# Patient Record
Sex: Female | Born: 1995 | Race: White | Hispanic: No | Marital: Married | Smoking: Never smoker
Health system: Southern US, Community
[De-identification: ages and names within clinical notes are randomized; demographics above are authoritative.]

## PROBLEM LIST (undated history)

## (undated) ENCOUNTER — Inpatient Hospital Stay (HOSPITAL_COMMUNITY): Payer: Self-pay

## (undated) DIAGNOSIS — F32A Depression, unspecified: Secondary | ICD-10-CM

## (undated) DIAGNOSIS — G43909 Migraine, unspecified, not intractable, without status migrainosus: Secondary | ICD-10-CM

## (undated) DIAGNOSIS — F329 Major depressive disorder, single episode, unspecified: Secondary | ICD-10-CM

## (undated) HISTORY — DX: Depression, unspecified: F32.A

## (undated) HISTORY — PX: INDUCED ABORTION: SHX677

## (undated) HISTORY — DX: Major depressive disorder, single episode, unspecified: F32.9

## (undated) HISTORY — DX: Migraine, unspecified, not intractable, without status migrainosus: G43.909

---
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
## 2014-01-01 ENCOUNTER — Encounter: Payer: Self-pay | Admitting: Women's Health

## 2014-01-01 ENCOUNTER — Ambulatory Visit (INDEPENDENT_AMBULATORY_CARE_PROVIDER_SITE_OTHER): Payer: Medicaid Other | Admitting: Women's Health

## 2014-01-01 NOTE — Progress Notes (Signed)
Patient ID: Tammie Black, female   DOB: 1995-09-14, 18 y.o.   MRN: [id number] Subjective:    Tammie Black is a 18 y.o. 551P1001 Caucasian female who presents for a postpartum visit. She is 4 weeks postpartum following a spontaneous vaginal delivery at 40.4 gestational weeks. Anesthesia: epidural. I have fully reviewed the prenatal and intrapartum course. Postpartum course has been complicated by pp endometritis @ 2wks pp, resolved w/ cipro. Baby's course has been uncomplicated. Baby is feeding by breast x 1 week, now bottle. Bleeding thinks she is on period now. Bowel function is normal. Bladder function is normal. Patient is not sexually active. Last sexual activity: prior to birth of baby. Contraception method is Nexplanon that was placed prior to her d/c from hospital- states it has been hurting her last few days and feels like it is slightly lower than it was before. Postpartum depression screening: negative. Score 4.  Last pap <21yo.  The following portions of the patient's history were reviewed and updated as appropriate: allergies, current medications, past medical history, past surgical history and problem list.  Review of Systems Pertinent items are noted in HPI.   Filed Vitals:   01/01/14 0939  BP: 98/64  Height: 5\' 5"  (1.651 m)  Weight: 280 lb (127.007 kg)   No LMP recorded.  Objective:   General:  alert, cooperative and no distress   Breasts:  deferred, no complaints  Lungs: clear to auscultation bilaterally  Heart:  regular rate and rhythm  Abdomen: soft, nontender   Vulva: normal  Vagina: normal vagina, menstrual type bleeding  Cervix:  closed  Corpus: Well-involuted  Adnexa:  Non-palpable  Rectal Exam: No hemorrhoids       Nexplanon site: Lt arm at site looks good, no bruising, redness, edema. nexplanon intact and is slightly lower than insertion scar. Mild tenderness to palpation.   Assessment:   Postpartum exam 4 wks s/p SVB Bottlefeeding Depression  screening Contraception counseling   Plan:  Can use ibuprofen/apap/heat/ice to Nexplanon site, if pain doesn't resolve/worsens to let us know Contraception: Nexplanon already in place Follow up in: @ 18yo  for pap & physical or earlier if needed  Marge DuncansBooker, Leala Bryand Randall CNM, [facility] 01/01/2014 9:52 AM

## 2014-01-01 NOTE — Patient Instructions (Signed)
Use ibuprofen or tylenol, ice or heat or alternate both to nexplanon site to see if it helps with pain. If pain doesn't go away or gets worse, please let us know.  Return when you turn 18 years old for your pap smear.

## 2014-01-08 ENCOUNTER — Other Ambulatory Visit: Payer: Self-pay | Admitting: Nurse Practitioner

## 2015-04-12 ENCOUNTER — Ambulatory Visit (INDEPENDENT_AMBULATORY_CARE_PROVIDER_SITE_OTHER): Payer: BLUE CROSS/BLUE SHIELD | Admitting: Pediatrics

## 2015-04-12 ENCOUNTER — Encounter: Payer: Self-pay | Admitting: Pediatrics

## 2015-04-12 VITALS — BP 120/83 | HR 92 | Temp 97.3°F | Ht 65.0 in | Wt 280.8 lb

## 2015-04-12 DIAGNOSIS — K529 Noninfective gastroenteritis and colitis, unspecified: Secondary | ICD-10-CM

## 2015-04-12 MED ORDER — ONDANSETRON 4 MG PO TBDP
4.0000 mg | ORAL_TABLET | Freq: Three times a day (TID) | ORAL | Status: DC | PRN
Start: 2015-04-12 — End: 2015-04-29

## 2015-04-12 NOTE — Progress Notes (Signed)
    Subjective:    Patient ID: Tammie Black, female    DOB: Jul 21, 1995, 20 y.o.   MRN: [id number]  CC: Emesis; and Diarrhea   HPI: Tammie Black is a 20 y.o. female presenting for  Emesis; and Diarrhea  Woke up yesterday with HA, acid reflux and nausea, started vomiting then Multiple episodes of diarrhea yesterday Ate [employer] the day before, vomiting started soon after that Maybe felt warm yesterday   Depression screen [facility] 04/12/2015 07/13/2012  Decreased Interest 0 0  Down, Depressed, Hopeless 0 3  PHQ - 2 Score 0 3  Altered sleeping - 3  Tired, decreased energy - 3  Change in appetite - 3  Feeling bad or failure about yourself  - 3  Trouble concentrating - 2  Moving slowly or fidgety/restless - 2  Suicidal thoughts - 0  PHQ-9 Score - 19     Relevant past medical, surgical, family and social history reviewed and updated as indicated. Interim medical history since our last visit reviewed. Allergies and medications reviewed and updated.    ROS: Per HPI unless specifically indicated above  History  Smoking status  . Never Smoker   Smokeless tobacco  . Never Used    Past Medical History Patient Active Problem List   Diagnosis Date Noted  . Postpartum endometritis 12/11/2013  . 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 Obesity 06/14/2013       Objective:    BP 120/83 mmHg  Pulse 92  Temp(Src) 97.3 F (36.3 C) (Oral)  Ht 5\' 5"  (1.651 m)  Wt 280 lb 12.8 oz (127.37 kg)  BMI 46.73 kg/m2  LMP 04/05/2015  Wt Readings from Last 3 Encounters:  04/12/15 280 lb 12.8 oz (127.37 kg)  01/01/14 280 lb (127.007 kg) (100 %*, Z = 2.73)  12/20/13 275 lb (124.739 kg) (100 %*, Z = 2.69)   * Growth percentiles are based on CDC 2-20 Years data.     Gen: NAD, alert, cooperative with exam, NCAT EYES: EOMI, no scleral injection or icterus ENT:  TMs pearly gray b/l, OP without erythema LYMPH: no cervical LAD CV: NRRR, normal S1/S2, no murmur, distal pulses 2+ b/l Resp: CTABL,  no wheezes, normal WOB Abd: +BS, soft, NTND. no guarding or organomegaly Ext: No edema, warm Neuro: Alert and oriented, strength equal b/l UE and LE, coordination grossly normal MSK: normal muscle bulk     Assessment & Plan:    Tammie Black was seen today for emesis and diarrhea. Zofran as needed. Symptoms improving. Lots of fluids.  Diagnoses and all orders for this visit:  Gastroenteritis -     ondansetron (ZOFRAN-ODT) 4 MG disintegrating tablet; Take 1 tablet (4 mg total) by mouth every 8 (eight) hours as needed for nausea or vomiting.    Follow up plan: Return if symptoms worsen or fail to improve.  Rex Krasarol Vincent, MD [facility] 04/12/2015, 9:37 AM

## 2015-04-29 ENCOUNTER — Encounter: Payer: Self-pay | Admitting: Family Medicine

## 2015-04-29 ENCOUNTER — Ambulatory Visit (INDEPENDENT_AMBULATORY_CARE_PROVIDER_SITE_OTHER): Payer: BLUE CROSS/BLUE SHIELD | Admitting: Family Medicine

## 2015-04-29 VITALS — BP 124/75 | HR 89 | Temp 98.9°F | Ht 65.0 in | Wt 280.0 lb

## 2015-04-29 DIAGNOSIS — J069 Acute upper respiratory infection, unspecified: Secondary | ICD-10-CM

## 2015-04-29 MED ORDER — FLUTICASONE PROPIONATE 50 MCG/ACT NA SUSP: 1.0000 | Freq: Two times a day (BID) | NASAL | Status: DC | PRN

## 2015-04-29 NOTE — Progress Notes (Signed)
BP 124/75 mmHg  Pulse 89  Temp(Src) 98.9 F (37.2 C) (Oral)  Ht 5\' 5"  (1.651 m)  Wt 280 lb (127.007 kg)  BMI 46.59 kg/m2  LMP 04/05/2015   Subjective:    Patient ID: Tammie Black, female    DOB: December 29, 1995, 19 y.o.   MRN: [id number]  HPI: Tammie Black is a 20 y.o. female presenting on 04/29/2015 for Sinusitis and Left eye red, draining and itching   HPI Sinus congestion and ear congestion Patient has been having sinus congestion and ear congestion and she woke up this morning with a red eye on the left. She's been using Tylenol Sinus and cold but nothing else. Patient is having postnasal drainage and a cough that is dry and nonproductive and is worse at night than during the day. She denies any fevers or chills or shortness of breath or wheezing. Small amount of eye d/c.  Relevant past medical, surgical, family and social history reviewed and updated as indicated. Interim medical history since our last visit reviewed. Allergies and medications reviewed and updated.  Review of Systems  Constitutional: Negative for fever and chills.  HENT: Positive for congestion, postnasal drip, rhinorrhea, sinus pressure, sneezing and sore throat. Negative for ear discharge and ear pain.   Eyes: Positive for discharge and redness. Negative for pain and visual disturbance.  Respiratory: Positive for cough. Negative for chest tightness and shortness of breath.   Cardiovascular: Negative for chest pain and leg swelling.  Genitourinary: Negative for dysuria and difficulty urinating.  Musculoskeletal: Negative for back pain and gait problem.  Skin: Negative for rash.  Neurological: Negative for light-headedness and headaches.  Psychiatric/Behavioral: Negative for behavioral problems and agitation.  All other systems reviewed and are negative.   Per HPI unless specifically indicated above     Medication List       This list is accurate as of: 04/29/15  1:59 PM.  Always use your most recent  med list.               baclofen 10 MG tablet  Commonly known as:  LIORESAL  Take 10 mg by mouth 3 (three) times daily.     fluticasone 50 MCG/ACT nasal spray  Commonly known as:  FLONASE  Place 1 spray into both nostrils 2 (two) times daily as needed for allergies or rhinitis.     zonisamide 25 MG capsule  Commonly known as:  ZONEGRAN  Take 25 mg by mouth daily.           Objective:    BP 124/75 mmHg  Pulse 89  Temp(Src) 98.9 F (37.2 C) (Oral)  Ht 5\' 5"  (1.651 m)  Wt 280 lb (127.007 kg)  BMI 46.59 kg/m2  LMP 04/05/2015  Wt Readings from Last 3 Encounters:  04/29/15 280 lb (127.007 kg)  04/12/15 280 lb 12.8 oz (127.37 kg)  01/01/14 280 lb (127.007 kg) (100 %*, Z = 2.73)   * Growth percentiles are based on CDC 2-20 Years data.    Physical Exam  Constitutional: She is oriented to person, place, and time. She appears well-developed and well-nourished. No distress.  HENT:  Right Ear: Tympanic membrane, external ear and ear canal normal.  Left Ear: Tympanic membrane, external ear and ear canal normal.  Nose: Mucosal edema and rhinorrhea present. No epistaxis. Right sinus exhibits no maxillary sinus tenderness and no frontal sinus tenderness. Left sinus exhibits no maxillary sinus tenderness and no frontal sinus tenderness.  Mouth/Throat: Uvula is midline and mucous membranes  are normal. Posterior oropharyngeal edema and posterior oropharyngeal erythema present. No oropharyngeal exudate or tonsillar abscesses.  Eyes: Conjunctivae and EOM are normal.  Neck: Neck supple. No thyromegaly present.  Cardiovascular: Normal rate, regular rhythm, normal heart sounds and intact distal pulses.   No murmur heard. Pulmonary/Chest: Effort normal and breath sounds normal. No respiratory distress. She has no wheezes.  Musculoskeletal: Normal range of motion. She exhibits no edema or tenderness.  Lymphadenopathy:    She has no cervical adenopathy.  Neurological: She is alert and  oriented to person, place, and time. Coordination normal.  Skin: Skin is warm and dry. No rash noted. She is not diaphoretic.  Psychiatric: She has a normal mood and affect. Her behavior is normal.  Vitals reviewed.     Assessment & Plan:       Problem List Items Addressed This Visit    None    Visit Diagnoses    Viral upper respiratory infection    -  Primary    Treat with Flonase, antihistamine, Mucinex, nasal saline. If not improved in 3-4 days give [place] a call and we can send an antibiotic.    Relevant Medications    fluticasone (FLONASE) 50 MCG/ACT nasal spray       Follow up plan: Return if symptoms worsen or fail to improve.  Counseling provided for all of the vaccine components No orders of the defined types were placed in this encounter.    Arville Care, MD [facility] 04/29/2015, 1:59 PM

## 2015-05-03 ENCOUNTER — Encounter (INDEPENDENT_AMBULATORY_CARE_PROVIDER_SITE_OTHER): Payer: Self-pay

## 2015-05-03 ENCOUNTER — Ambulatory Visit (INDEPENDENT_AMBULATORY_CARE_PROVIDER_SITE_OTHER): Payer: BLUE CROSS/BLUE SHIELD | Admitting: Nurse Practitioner

## 2015-05-03 ENCOUNTER — Encounter: Payer: Self-pay | Admitting: Nurse Practitioner

## 2015-05-03 VITALS — BP 106/76 | HR 93 | Temp 97.5°F | Ht 65.0 in | Wt 281.0 lb

## 2015-05-03 DIAGNOSIS — H66001 Acute suppurative otitis media without spontaneous rupture of ear drum, right ear: Secondary | ICD-10-CM

## 2015-05-03 MED ORDER — AMOXICILLIN 875 MG PO TABS: 875.0000 mg | ORAL_TABLET | Freq: Two times a day (BID) | ORAL | Status: DC

## 2015-05-03 NOTE — Progress Notes (Signed)
   Subjective:    Patient ID: Tammie Black, female    DOB: 05-Jul-1995, 20 y.o.   MRN: [id number] Pt was in last week with cough. Was told to take Claritin and Mucinex. Gave Rx for Flonase. Now feeling pressure in the right ear effecting her hearing.  Ear Fullness  There is pain in the right ear. This is a new problem. The current episode started in the past 7 days. The problem occurs constantly. The problem has been gradually worsening. There has been no fever. The pain is at a severity of 0/10. The patient is experiencing no pain. Associated symptoms include hearing loss. She has tried nothing for the symptoms. The treatment provided mild relief.      Review of Systems  HENT: Positive for hearing loss.        Feels a continuous pressure in the right ear. Denies any discharge or pain. Yawning relieves the pressure short term but it returns.  Eyes: Negative.   Respiratory: Negative.   Musculoskeletal: Negative.   Skin: Negative.   Neurological: Negative.        Objective:   Physical Exam  Constitutional: She is oriented to person, place, and time. She appears well-developed and well-nourished.  HENT:  Left Ear: External ear normal.  Nose: Nose normal.  Mouth/Throat: Oropharynx is clear and moist.  Scar on TM. Redness noted  Eyes: Conjunctivae are normal. Pupils are equal, round, and reactive to light.  Neck: Normal range of motion.  Cardiovascular: Normal rate, regular rhythm, normal heart sounds and intact distal pulses.   Pulmonary/Chest: Effort normal and breath sounds normal.  Abdominal: Soft.  Musculoskeletal: Normal range of motion.  Neurological: She is alert and oriented to person, place, and time.  Skin: Skin is warm and dry.  Psychiatric: She has a normal mood and affect. Her behavior is normal. Judgment and thought content normal.   BP 106/76 mmHg  Pulse 93  Temp(Src) 97.5 F (36.4 C) (Oral)  Ht 5\' 5"  (1.651 m)  Wt 281 lb (127.461 kg)  BMI 46.76 kg/m2  LMP  04/05/2015        Assessment & Plan:   1. Acute suppurative otitis media of right ear without spontaneous rupture of tympanic membrane, recurrence not specified    Meds ordered this encounter  Medications  . amoxicillin (AMOXIL) 875 MG tablet    Sig: Take 1 tablet (875 mg total) by mouth 2 (two) times daily. 1 po BID    Dispense:  20 tablet    Refill:  0    Order Specific Question:  Supervising Provider    Answer:  Deborra MedinaMOORE, DONALD W [[id number]]   Motrin or tylenol OTC Force fluids RTO prn  Mary-Margaret Daphine DeutscherMartin, FNP

## 2015-05-03 NOTE — Patient Instructions (Signed)

## 2015-05-07 ENCOUNTER — Other Ambulatory Visit: Payer: BLUE CROSS/BLUE SHIELD | Admitting: Women's Health

## 2015-05-20 ENCOUNTER — Other Ambulatory Visit (HOSPITAL_COMMUNITY)
Admission: RE | Admit: 2015-05-20 | Discharge: 2015-05-20 | Disposition: A | Discharge status: Home / Self Care | Payer: BLUE CROSS/BLUE SHIELD | Source: Ambulatory Visit | Attending: Obstetrics & Gynecology | Admitting: Obstetrics & Gynecology

## 2015-05-20 ENCOUNTER — Ambulatory Visit (INDEPENDENT_AMBULATORY_CARE_PROVIDER_SITE_OTHER): Payer: BLUE CROSS/BLUE SHIELD | Admitting: Women's Health

## 2015-05-20 ENCOUNTER — Encounter: Payer: Self-pay | Admitting: Women's Health

## 2015-05-20 VITALS — BP 116/70 | HR 64 | Ht 66.25 in | Wt 282.0 lb

## 2015-05-20 DIAGNOSIS — Z113 Encounter for screening for infections with a predominantly sexual mode of transmission: Secondary | ICD-10-CM | POA: Diagnosis present

## 2015-05-20 DIAGNOSIS — Z01419 Encounter for gynecological examination (general) (routine) without abnormal findings: Secondary | ICD-10-CM | POA: Insufficient documentation

## 2015-05-20 NOTE — Progress Notes (Signed)
Patient ID: Tammie Black, female   DOB: Jun 22, 1995, 20 y.o.   MRN: [id number] Subjective:   Tammie ShoeMonica Allmon is a 20 y.o. 881P1001 Caucasian female here for a routine well-woman exam.  No LMP recorded. Patient has had an implant.    Current complaints: none PCP: Paulene FloorMary Martin, [facility]       Does desire labs, declines STI testing other than gc/ct  Social History: Sexual: heterosexual Marital Status: married Living situation: with parents, [place] d/t work Occupation: [employer] [place], stock Tobacco/alcohol: no tobacoo, etoh: none Illicit drugs: no history of illicit drug use  The following portions of the patient's history were reviewed and updated as appropriate: allergies, current medications, past family history, past medical history, past social history, past surgical history and problem list.  Past Medical History Past Medical History  Diagnosis Date  . Migraines   . Depression     Past Surgical History Past Surgical History  Procedure Laterality Date  . Induced abortion      Gynecologic History G1P1001  No LMP recorded. Patient has had an implant. Contraception: Nexplanon 11/29/13 Last Pap: never. Results were: n/a Last mammogram: never. Results were: n/a Last TCS: never  Obstetric History OB History  Gravida Para Term Preterm AB SAB TAB Ectopic Multiple Living  1 1 1       1     # Outcome Date GA Lbr Len/2nd Weight Sex Delivery Anes PTL Lv  1 Term 11/27/13 [id number] 15:24 / 04:31  M Vag-Spont EPI  Y      Current Medications Current Outpatient Prescriptions on File Prior to Visit  Medication Sig Dispense Refill  . baclofen (LIORESAL) 10 MG tablet Take 10 mg by mouth 3 (three) times daily.    Marland Kitchen. zonisamide (ZONEGRAN) 25 MG capsule Take 25 mg by mouth daily.    Marland Kitchen. amoxicillin (AMOXIL) 875 MG tablet Take 1 tablet (875 mg total) by mouth 2 (two) times daily. 1 po BID (Patient not taking: Reported on 05/20/2015) 20 tablet 0  . fluticasone (FLONASE)  50 MCG/ACT nasal spray Place 1 spray into both nostrils 2 (two) times daily as needed for allergies or rhinitis. (Patient not taking: Reported on 05/20/2015) 16 g 6   No current facility-administered medications on file prior to visit.    Review of Systems Patient denies any headaches, blurred vision, shortness of breath, chest pain, abdominal pain, problems with bowel movements, urination, or intercourse.  Objective:  BP 116/70 mmHg  Pulse 64  Ht 5' 6.25" (1.683 m)  Wt 282 lb (127.914 kg)  BMI 45.16 kg/m2 Physical Exam  General:  Well developed, well nourished, no acute distress. She is alert and oriented x3. Skin:  Warm and dry Neck:  Midline trachea, no thyromegaly or nodules Cardiovascular: Regular rate and rhythm, no murmur heard Lungs:  Effort normal, all lung fields clear to auscultation bilaterally Breasts:  No dominant palpable mass, retraction, or nipple discharge Abdomen:  Soft, non tender, no hepatosplenomegaly or masses Pelvic:  External genitalia is normal in appearance.  The vagina is normal in appearance. The cervix is bulbous, no CMT.  Thin prep pap is done w/ reflex HR HPV cotesting. Uterus is felt to be normal size, shape, and contour.  No adnexal masses or tenderness noted. Extremities:  No swelling or varicosities noted Psych:  She has a normal mood and affect  Assessment:   Healthy well-woman exam Obesity  Plan:  GC/CT from pap, CBC, CMP, TSH today F/U [id number] for physical, or sooner if  needed Mammogram  or sooner if problems Colonoscopy  or sooner if problems  Marge Duncans CNM, [facility] 05/20/2015 12:11 PM

## 2015-05-21 LAB — COMPREHENSIVE METABOLIC PANEL
ALBUMIN: 4.3 g/dL (ref 3.5–5.5)
ALK PHOS: 108 IU/L (ref 39–117)
ALT: 14 IU/L (ref 0–32)
AST: 15 IU/L (ref 0–40)
Albumin/Globulin Ratio: 1.9 (ref 1.2–2.2)
BUN / CREAT RATIO: 15 (ref 9–23)
BUN: 9 mg/dL (ref 6–20)
Bilirubin Total: 0.6 mg/dL (ref 0.0–1.2)
CO2: 20 mmol/L (ref 18–29)
Calcium: 9 mg/dL (ref 8.7–10.2)
Chloride: 104 mmol/L (ref 96–106)
Creatinine, Ser: 0.62 mg/dL (ref 0.57–1.00)
GFR calc Af Amer: 149 mL/min/{1.73_m2} (ref >59)
GFR calc non Af Amer: 129 mL/min/{1.73_m2} (ref >59)
Globulin, Total: 2.3 g/dL (ref 1.5–4.5)
Glucose: 83 mg/dL (ref 65–99)
Potassium: 4.7 mmol/L (ref 3.5–5.2)
SODIUM: 140 mmol/L (ref 134–144)
Total Protein: 6.6 g/dL (ref 6.0–8.5)

## 2015-05-21 LAB — CBC
HEMOGLOBIN: 14.6 g/dL (ref 11.1–15.9)
Hematocrit: 43.2 % (ref 34.0–46.6)
MCH: 31.2 pg (ref 26.6–33.0)
MCHC: 33.8 g/dL (ref 31.5–35.7)
MCV: 92 fL (ref 79–97)
Platelets: 306 10*3/uL (ref 150–379)
RBC: 4.68 x10E6/uL (ref 3.77–5.28)
RDW: 13.3 % (ref 12.3–15.4)
WBC: 8.8 10*3/uL (ref 3.4–10.8)

## 2015-05-21 LAB — CYTOLOGY - PAP

## 2015-05-21 LAB — TSH: TSH: 1.31 u[IU]/mL (ref 0.450–4.500)

## 2015-07-31 ENCOUNTER — Telehealth: Payer: Self-pay | Admitting: Nurse Practitioner

## 2015-07-31 NOTE — Telephone Encounter (Signed)
Pt needed appt appt scheduled Pt notified

## 2015-08-01 ENCOUNTER — Encounter: Payer: Self-pay | Admitting: Family

## 2015-08-01 ENCOUNTER — Ambulatory Visit (INDEPENDENT_AMBULATORY_CARE_PROVIDER_SITE_OTHER): Payer: BLUE CROSS/BLUE SHIELD | Admitting: Family

## 2015-08-01 VITALS — BP 119/73 | HR 74 | Temp 97.7°F | Ht 66.5 in | Wt 281.0 lb

## 2015-08-01 DIAGNOSIS — L0291 Cutaneous abscess, unspecified: Secondary | ICD-10-CM

## 2015-08-01 MED ORDER — SULFAMETHOXAZOLE-TRIMETHOPRIM 800-160 MG PO TABS: 1.0000 | ORAL_TABLET | Freq: Two times a day (BID) | ORAL | Status: DC

## 2015-08-01 NOTE — Progress Notes (Signed)
   Subjective:    Patient ID: Tammie Black, female    DOB: 03-Dec-1995, 20 y.o.   MRN: [id number]  HPI PT presents to the office today with a "bump" under her right axillary a few months ago. PT states she squeezed it a few times a got a "white drainage" and it became smaller. PT states that it has since grown, but over the last few days it has become small again. PT states she has intermittent pain of 3 out 10 and it is worse when she touches it.     Review of Systems  Constitutional: Negative.   HENT: Negative.   Eyes: Negative.   Respiratory: Negative.  Negative for shortness of breath.   Cardiovascular: Negative.  Negative for palpitations.  Gastrointestinal: Negative.   Endocrine: Negative.   Genitourinary: Negative.   Musculoskeletal: Negative.   Neurological: Negative.  Negative for headaches.  Hematological: Negative.   Psychiatric/Behavioral: Negative.   All other systems reviewed and are negative.      Objective:   Physical Exam  Constitutional: She is oriented to person, place, and time. She appears well-developed and well-nourished. No distress.  HENT:  Head: Normocephalic.  Eyes: Pupils are equal, round, and reactive to light.  Neck: Normal range of motion. Neck supple. No thyromegaly present.  Cardiovascular: Normal rate, regular rhythm, normal heart sounds and intact distal pulses.   No murmur heard. Pulmonary/Chest: Effort normal and breath sounds normal. No respiratory distress. She has no wheezes.  Abdominal: Soft. Bowel sounds are normal. She exhibits no distension. There is no tenderness.  Musculoskeletal: Normal range of motion. She exhibits no edema or tenderness.  Neurological: She is alert and oriented to person, place, and time.  Skin: Skin is warm and dry.  Small erythemas abscess under right axillary  approx 1X1cm  Psychiatric: She has a normal mood and affect. Her behavior is normal. Judgment and thought content normal.  Vitals reviewed.    BP  119/73 mmHg  Pulse 74  Temp(Src) 97.7 F (36.5 C) (Oral)  Ht 5' 6.5" (1.689 m)  Wt 281 lb (127.461 kg)  BMI 44.68 kg/m2      Assessment & Plan:  1. Abscess -Keep clean and dry -Warm compresses -Do not squeeze -RTO prn  - sulfamethoxazole-trimethoprim (BACTRIM DS) 800-160 MG tablet; Take 1 tablet by mouth 2 (two) times daily.  Dispense: 20 tablet; Refill: 0  Jannifer Rodneyhristy Hawks, FNP

## 2015-08-01 NOTE — Patient Instructions (Signed)

## 2015-11-05 ENCOUNTER — Encounter: Payer: Self-pay | Admitting: Family Medicine

## 2015-11-05 ENCOUNTER — Ambulatory Visit (INDEPENDENT_AMBULATORY_CARE_PROVIDER_SITE_OTHER): Payer: BLUE CROSS/BLUE SHIELD | Admitting: Family Medicine

## 2015-11-05 VITALS — BP 124/86 | HR 69 | Temp 97.4°F | Ht 66.5 in | Wt 282.8 lb

## 2015-11-05 DIAGNOSIS — G43909 Migraine, unspecified, not intractable, without status migrainosus: Secondary | ICD-10-CM | POA: Diagnosis not present

## 2015-11-05 DIAGNOSIS — Z23 Encounter for immunization: Secondary | ICD-10-CM | POA: Diagnosis not present

## 2015-11-05 MED ORDER — TOPIRAMATE 50 MG PO TABS: ORAL_TABLET | ORAL | 2 refills | Status: DC

## 2015-11-05 NOTE — Patient Instructions (Signed)
Great to see you!  I have started you on topamax for your headaches, Try 2 aleve and a baclofen in the first 30 minutes of your headache to see if you can get it to stop  Come back in 2 months to see how you are

## 2015-11-05 NOTE — Progress Notes (Signed)
   HPI  Patient presents today here for headaches.  Patient has long history of migraine headaches, previously treated by [facility] Ne[facility]lyFreeman [facility]. She does not have the money to continue going to the clinic so she comes in for evaluation here.  She states that a few months ago she stopped taking Zonegran due to difficulty sleeping with the medication.  Over the last several weeks she's begun to have more and more frequent headaches. Her headaches are described as lasting 10-12 hours, at times several days, bitemporal and biparietal throbbing in nature, no aura, she does have associated nausea and sometimes photophobia.  Uses Nexplanon for birth control. Her last period was about 3 weeks ago.  PMH: Smoking status noted ROS: Per HPI  Objective: BP 124/86   Pulse 69   Temp 97.4 F (36.3 C) (Oral)   Ht 5' 6.5" (1.689 m)   Wt 282 lb 12.8 oz (128.3 kg)   BMI 44.96 kg/m  Gen: NAD, alert, cooperative with exam HEENT: NCAT, EOMI, PERRL CV: RRR, good S1/S2, no murmur Resp: CTABL, no wheezes, non-labored Ext: No edema, warm Neuro: Alert and oriented, No gross deficits  Assessment and plan:  # Migraine headaches Recent increase in headaches, patient would like to start prophylactic treatment Headaches described as almost daily in frequency, previous to that she had 4-5 per month. Previous success with Topamax, she does have controlled currently with the Nexplanon in place Titrate 25 mg to 100 mg Topamax in one month, discussed abortive therapy baclofen plus NSAIDs Return to clinic in 6-8 weeks   Meds ordered this encounter  Medications  . topiramate (TOPAMAX) 50 MG tablet    Sig: Take 1/2 daily for 1 week, then take 1/2 twice daily, then take 1 in morning and 1/2 at night for 1 week, then take 1 twice daily.    Dispense:  60 tablet    Refill:  2    Murtis SinkSam Maelie Chriswell, MD [facility] 11/05/2015, 11:08 AM

## 2015-12-17 ENCOUNTER — Ambulatory Visit: Payer: BLUE CROSS/BLUE SHIELD | Admitting: Obstetrics & Gynecology

## 2015-12-31 ENCOUNTER — Ambulatory Visit (INDEPENDENT_AMBULATORY_CARE_PROVIDER_SITE_OTHER): Payer: BLUE CROSS/BLUE SHIELD | Admitting: Family Medicine

## 2015-12-31 ENCOUNTER — Ambulatory Visit (INDEPENDENT_AMBULATORY_CARE_PROVIDER_SITE_OTHER): Payer: BLUE CROSS/BLUE SHIELD

## 2015-12-31 ENCOUNTER — Encounter: Payer: Self-pay | Admitting: Family Medicine

## 2015-12-31 VITALS — BP 115/71 | HR 84 | Temp 98.6°F | Ht 66.5 in | Wt 286.4 lb

## 2015-12-31 DIAGNOSIS — M546 Pain in thoracic spine: Secondary | ICD-10-CM

## 2015-12-31 DIAGNOSIS — G43009 Migraine without aura, not intractable, without status migrainosus: Secondary | ICD-10-CM | POA: Diagnosis not present

## 2015-12-31 DIAGNOSIS — G8929 Other chronic pain: Secondary | ICD-10-CM

## 2015-12-31 NOTE — Progress Notes (Signed)
   HPI  Patient presents today here to follow-up for migraines and also with new complaint of back pain.  Patient complains of midline thoracic back pain radiating to the midline lumbar spine that started after her epidural 2 years ago. At first it was off and on, when she got a job at [employer] began working more physically she has more frequent pain. She states that turning to the right causes more pain. It's a sharp shooting type pain that happens with bending or twisting and lifting something that lasts a few moments and then resolves. She denies any leg or arm weakness, numbness or tingling. There has been no discrete injury, except that it started after the epidural.  Patient has had very good benefit with Topamax, she's had a significant reduction in her migraine headaches. Baclofen also helps for abortive therapy.  Her birth control is Nexplanon  PMH: Smoking status noted ROS: Per HPI  Objective: BP 115/71   Pulse 84   Temp 98.6 F (37 C) (Oral)   Ht 5' 6.5" (1.689 m)   Wt 286 lb 6.4 oz (129.9 kg)   BMI 45.53 kg/m  Gen: NAD, alert, cooperative with exam HEENT: NCAT CV: RRR, good S1/S2, no murmur Resp: CTABL, no wheezes, non-labored Ext: No edema, warm Neuro: Alert and oriented, strength 5/5 and sensation intact in bilateral lower extremities, negative modified straight leg raise MSK No tenderness to palpation of the spine along the thoracic or lumbar spine midline or paraspinal muscles in the thoracic or lumbar areas  Assessment and plan:  # Thoracic chronic midline back pain Problem Unclear etiology, most likely muscle spasm Plain film today, physical therapy Discussed supportive care including massage, heat, baclofen which you are he has for headaches, as well as NSAIDs  # migraine headaches Good control with Topamax, no changes    Orders Placed This Encounter  Procedures  . DG Thoracic Spine 2 View    Standing Status:   Future    Standing Expiration Date:    03/01/2017    Order Specific Question:   Reason for Exam (SYMPTOM  OR DIAGNOSIS REQUIRED)    Answer:   Thoracic back pain    Order Specific Question:   Is the patient pregnant?    Answer:   No    Order Specific Question:   Preferred imaging location?    Answer:   Internal  . Ambulatory referral to Physical Therapy    Referral Priority:   Routine    Referral Type:   Physical Medicine    Referral Reason:   Specialty Services Required    Requested Specialty:   Physical Therapy    Number of Visits Requested:   1    No orders of the defined types were placed in this encounter.   Murtis SinkSam Chayim Bialas, MD [facility] 12/31/2015, 8:24 AM

## 2015-12-31 NOTE — Patient Instructions (Signed)
Great to see you!  Try the tennis ball massage, heat, and/or baclofen for the back pain.   We will call with x ray results within a week  You will be called by physical therapy to set up PT treatments

## 2016-01-01 ENCOUNTER — Telehealth: Payer: Self-pay | Admitting: Family Medicine

## 2016-01-01 NOTE — Telephone Encounter (Signed)
[email or web address]

## 2016-01-20 ENCOUNTER — Ambulatory Visit: Payer: BLUE CROSS/BLUE SHIELD | Attending: Family Medicine | Admitting: Physical Therapy

## 2016-01-20 DIAGNOSIS — M545 Low back pain: Secondary | ICD-10-CM | POA: Diagnosis not present

## 2016-01-20 DIAGNOSIS — G8929 Other chronic pain: Secondary | ICD-10-CM | POA: Diagnosis present

## 2016-01-20 NOTE — Therapy (Signed)
[facility] [address] [place], [place], [address] Phone: [phone]   Fax:  [phone]  Physical Therapy Evaluation  Patient Details  Name: Tammie ShoeMonica Black MRN: [id number] Date of Birth: [birth year] Referring Provider: Kevin FentonSamuel Bradshaw MD  Encounter Date: 01/20/2016      PT End of Session - 01/20/16 0906    Visit Number 1   Number of Visits 8   Date for PT Re-Evaluation 03/02/16   PT Start Time 0905   PT Stop Time 0944   PT Time Calculation (min) 39 min   Activity Tolerance Patient tolerated treatment well   Behavior During Therapy [facility] for tasks assessed/performed      Past Medical History:  Diagnosis Date  . Depression   . Migraines     Past Surgical History:  Procedure Laterality Date  . INDUCED ABORTION      There were no vitals filed for this visit.       Subjective Assessment - 01/20/16 0907    Subjective Two years ago patient received an epidural and ever since she has had back pain. It has become more frequent almost daily. 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   Diagnostic tests xrays thoracic spine neg.   Patient Stated Goals get rid of pain   Currently in Pain? No/denies  5-6/10 when it occurs   Pain Location Back   Pain Orientation Left;Lower;Mid   Pain Descriptors / Indicators Sharp;Throbbing   Pain Type Chronic pain   Pain Onset More than a month ago   Pain Frequency Intermittent   Aggravating Factors  unknown   Pain Relieving Factors unknown   Effect of Pain on Daily Activities painful ADLS            [facility] - 01/20/16 0001      Assessment   Medical Diagnosis chronic midline thoracic back pain   Referring Provider Kevin FentonSamuel Bradshaw MD   Onset Date/Surgical Date 11/02/13   Next MD Visit nothing scheduled     Precautions   Precautions None     Balance Screen   Has the patient fallen in the past 6 months No   Has the patient had a decrease in activity level because of a  fear of falling?  No   Is the patient reluctant to leave their home because of a fear of falling?  No     Prior Function   Level of Independence Independent     Posture/Postural Control   Posture Comments Rounded shoulders B, L shoulder forward greater than R; Increased lumbar lordosisi     ROM / Strength   AROM / PROM / Strength AROM                           PT Education - 01/20/16 0946    Education provided Yes   Education Details HEP; multiple HF stretches attempted. Patient could feel only supine of EOB, however her bed is not high enough.   Person(s) Educated Patient   Methods Explanation;Demonstration;Handout;Verbal cues   Comprehension Verbalized understanding;Returned demonstration             PT Long Term Goals - 01/20/16 0956      PT LONG TERM GOAL #1   Title I with HEP   Time 6   Period Weeks   Status New     PT LONG TERM GOAL #2   Title Patient to report decreased frequency of pain in back by 75%  Time 6   Period Weeks   Status New     PT LONG TERM GOAL #3   Title Patient to be able to perform ADLS with 1/10 pain or less.   Time 6   Period Weeks   Status New               Plan - 01/20/16 0948    Clinical Impression Statement Patient presents with c/o low back pain beginning after an epidural in 2015. Patient has marked tightness in her L HS and psoas muscles likely contributing to her pain and stands with increased lumbar lordosis. Pelvic landmarks are even and she has no pain with spinal mobs or AROM. Patient lifts a lot in her job and will benefit from core strengthening.   PT Frequency 2x / week   PT Duration 4 weeks   PT Treatment/Interventions ADLs/Self Care Home Management;Electrical Stimulation;Moist Heat;Ultrasound;Therapeutic exercise;Neuromuscular re-education;Patient/family education;Manual techniques;Dry needling   PT Next Visit Plan Release left hip flexor, find a HF stretch that she can do at home, hamstrings  streching, core strength. Postural ed for neutral spine. Progress HEP    PT Home Exercise Plan HS stretch, and core (hooklying with marching)   Consulted and Agree with Plan of Care Patient      Patient will benefit from skilled therapeutic intervention in order to improve the following deficits and impairments:  Pain, Impaired flexibility, Postural dysfunction  Visit Diagnosis: Chronic left-sided low back pain without sciatica - Plan: PT plan of care cert/re-cert     Problem List Patient Active Problem List   Diagnosis Date Noted  . Postpartum endometritis 12/11/2013  . 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 Obesity 06/14/2013    Solon PalmJulie Sharbel Sahagun PT 01/20/2016, 10:03 AM  [facility] [address] [place], [place], [address] Phone: [phone]   Fax:  [phone]  Name: Tammie ShoeMonica Black MRN: [id number] Date of Birth: 1995/05/03

## 2016-01-20 NOTE — Patient Instructions (Signed)
Hamstring Stretch   With other leg bent, foot flat, grasp right leg and slowly try to straighten knee. Hold __30-60 seconds. Repeat __3__ times. Do __2-3__ sessions per day.   Hamstring Stretch, Reclined (Strap, Doorframe)  Lower abdominal/core stability exercises  1. Practice your breathing technique: Inhale through your nose expanding your belly and rib cage. Try not to breathe into your chest. Exhale slowly and gradually out your mouth feeling a sense of softness to your body. Practice multiple times. This can be performed unlimited.  2. Finding the lower abdominals. Laying on your back with the knees bent, place your fingers just below your belly button. Using your breathing technique from above, on your exhale gently pull the belly button away from your fingertips without tensing any other muscles. Practice this 5x. Next, as you exhale, draw belly button inwards and hold onto it...then feel as if you are pulling that muscle across your pelvis like you are tightening a belt. This can be hard to do at first so be patient and practice. Do 5-10 reps 1-3 x day. Always recognize quality over quantity; if your abdominal muscles become tired you will notice you may tighten/contract other muscles. This is the time to take a break.   Practice this first laying on your back, then in sitting, progressing to standing and finally adding it to all your daily movements.   3. Finding your pelvic floor. Using the breathing technique above, when your exhale, this time draw your pelvic floor muscles up as if you were attempting to stop the flow of urination. Be careful NOT to tense any other muscles. This can be hard, BE PATIENT. Try to hold up to 10 seconds repeating 10x. Try 2x a day. Once you feel you are doing this well, add this contraction to exercise #2. First contracting your pelvic floor followed by lower abdominals.   4. Adding leg movements. Add the following leg movements to challenge your ability  to keep your core stable:   Marching: While keeping your pelvis still, lift the right foot a few inches, put it down then lift left foot. This will mimic a march. Then lift the other leg. Return the first leg and then the second.  Start slow to establish control. Once you have control you may speed it up. Do 10-20x. You MUST keep your lower abdominlas contracted while you march. Breathe naturally   Tammie PalmJulie Karlissa Black, PT 01/20/16 9:43 AM [facility] [address] [place], [place], [address] Phone: [phone]   Fax:  [phone]

## 2016-01-23 NOTE — Telephone Encounter (Signed)
Patient aware of xray results on 11/29

## 2016-01-29 ENCOUNTER — Ambulatory Visit: Payer: BLUE CROSS/BLUE SHIELD | Admitting: *Deleted

## 2016-01-29 DIAGNOSIS — G8929 Other chronic pain: Secondary | ICD-10-CM

## 2016-01-29 DIAGNOSIS — M545 Low back pain, unspecified: Secondary | ICD-10-CM

## 2016-01-29 NOTE — Therapy (Signed)
[facility] [address] [place], [place], [address] Phone: [phone]   Fax:  [phone]  Physical Therapy Treatment  Patient Details  Name: Tammie Black MRN: [id number] Date of Birth: 08-13-1995 Referring Provider: Kevin FentonSamuel Bradshaw MD  Encounter Date: 01/29/2016      PT End of Session - 01/29/16 1032    Visit Number 2   Number of Visits 8   Date for PT Re-Evaluation 03/02/16   PT Start Time 0945   PT Stop Time 1039   PT Time Calculation (min) 54 min      Past Medical History:  Diagnosis Date  . Depression   . Migraines     Past Surgical History:  Procedure Laterality Date  . INDUCED ABORTION      There were no vitals filed for this visit.      Subjective Assessment - 01/29/16 0959    Subjective Two years ago patient received an epidural and ever since she has had back pain. It has become more frequent almost daily. 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                         [facility] Adult PT Treatment/Exercise - 01/29/16 0001      Posture/Postural Control   Posture Comments Rounded shoulders B, L shoulder forward greater than R; Increased lumbar lordosisi     Exercises   Exercises Lumbar;Knee/Hip     Lumbar Exercises: Stretches   Active Hamstring Stretch 5 reps;30 seconds  Bil   Quad Stretch 5 reps;30 seconds  standing Both LEs     Modalities   Modalities Electrical Stimulation;Ultrasound;Moist Heat     Moist Heat Therapy   Number Minutes Moist Heat 15 Minutes   Moist Heat Location Lumbar Spine     Electrical Stimulation   Electrical Stimulation Location Thoracolumbar paras IFC 80-150hz  x 15 mins   Electrical Stimulation Goals Pain     Ultrasound   Ultrasound Location Bil LB paras T12-L2   Ultrasound Parameters 1.5 w/cm2 x 10 mins sidelying   Ultrasound Goals Pain                     PT Long Term Goals - 01/20/16 0956      PT LONG TERM GOAL #1   Title I with HEP    Time 6   Period Weeks   Status New     PT LONG TERM GOAL #2   Title Patient to report decreased frequency of pain in back by 75%   Time 6   Period Weeks   Status New     PT LONG TERM GOAL #3   Title Patient to be able to perform ADLS with 1/10 pain or less.   Time 6   Period Weeks   Status New               Plan - 01/29/16 1034    Clinical Impression Statement Pt did fairly well with Rx today and had decreased pain after Rx. She was able to perform hip flexor and HS stretches and is independent in HEP for both.  She also did well with US and other modalities.   PT Frequency 2x / week   PT Duration 4 weeks   PT Treatment/Interventions ADLs/Self Care Home Management;Electrical Stimulation;Moist Heat;Ultrasound;Therapeutic exercise;Neuromuscular re-education;Patient/family education;Manual techniques;Dry needling   PT Next Visit Plan Release left hip flexor, find a HF stretch that she can do at home, hamstrings streching,  core strength. Postural ed for neutral spine. Progress HEP    PT Home Exercise Plan HS stretch, and core (hooklying with marching)   Consulted and Agree with Plan of Care Patient      Patient will benefit from skilled therapeutic intervention in order to improve the following deficits and impairments:  Pain, Impaired flexibility, Postural dysfunction  Visit Diagnosis: Chronic left-sided low back pain without sciatica     Problem List Patient Active Problem List   Diagnosis Date Noted  . Postpartum endometritis 12/11/2013  . 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 Obesity 06/14/2013    RAMSEUR,CHRIS, PTA 01/29/2016, 11:02 AM  [facility] [address] [place], [place], [address] Phone: [phone]   Fax:  [phone]  Name: Tammie Black MRN: [id number] Date of Birth: 09/03/1995

## 2016-02-04 ENCOUNTER — Ambulatory Visit: Payer: BLUE CROSS/BLUE SHIELD | Attending: Family Medicine | Admitting: Physical Therapy

## 2016-02-04 DIAGNOSIS — M545 Low back pain, unspecified: Secondary | ICD-10-CM

## 2016-02-04 DIAGNOSIS — G8929 Other chronic pain: Secondary | ICD-10-CM | POA: Insufficient documentation

## 2016-02-04 NOTE — Therapy (Signed)
[facility] [address] [place], [place], [address] Phone: [phone]   Fax:  [phone]  Physical Therapy Treatment  Patient Details  Name: Tammie ShoeMonica Detore MRN: [id number] Date of Birth: [birth year] Referring Provider: Kevin FentonSamuel Bradshaw MD  Encounter Date: 02/04/2016      PT End of Session - 02/04/16 1052    PT Start Time 0945   PT Stop Time 1041   PT Time Calculation (min) 56 min   Activity Tolerance Patient tolerated treatment well   Behavior During Therapy [facility] for tasks assessed/performed      Past Medical History:  Diagnosis Date  . Depression   . Migraines     Past Surgical History:  Procedure Laterality Date  . INDUCED ABORTION      There were no vitals filed for this visit.      Subjective Assessment - 02/04/16 1020    Subjective My back is feeling good today.   Currently in Pain? Yes   Pain Location Back   Pain Orientation Left;Lower;Mid   Pain Descriptors / Indicators Aching;Throbbing;Sharp   Pain Onset More than a month ago                         [facility] PT Treatment/Exercise - 02/04/16 0001      Electrical Stimulation   Electrical Stimulation Location Thoracolumbar    Electrical Stimulation Action IFC   Electrical Stimulation Parameters 80-150 Hz x 20 minutes.   Electrical Stimulation Goals Pain     Ultrasound   Ultrasound Location Affected lumbar region.   Ultrasound Parameters Left sdly position with folded pillow between knees at 1.50 w/CM2 x 14 minutes.   Ultrasound Goals Pain     Manual Therapy   Manual Therapy Soft tissue mobilization   Manual therapy comments STW/M x 9 minutes.                     PT Long Term Goals - 01/20/16 0956      PT LONG TERM GOAL #1   Title I with HEP   Time 6   Period Weeks   Status New     PT LONG TERM GOAL #2   Title Patient to report decreased frequency of pain in back by 75%   Time 6   Period Weeks   Status New      PT LONG TERM GOAL #3   Title Patient to be able to perform ADLS with 1/10 pain or less.   Time 6   Period Weeks   Status New             Patient will benefit from skilled therapeutic intervention in order to improve the following deficits and impairments:  Pain, Impaired flexibility, Postural dysfunction  Visit Diagnosis: Chronic left-sided low back pain without sciatica     Problem List Patient Active Problem List   Diagnosis Date Noted  . Postpartum endometritis 12/11/2013  . 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 Obesity 06/14/2013    Hershey Knauer, [place] MPT 02/04/2016, 10:53 AM  [facility] [address] [place], [place], [address] Phone: [phone]   Fax:  [phone]  Name: Tammie ShoeMonica Black MRN: [id number] Date of Birth: [birth year]

## 2016-02-11 ENCOUNTER — Encounter: Payer: Self-pay | Admitting: Physician Assistant

## 2016-02-11 ENCOUNTER — Ambulatory Visit (INDEPENDENT_AMBULATORY_CARE_PROVIDER_SITE_OTHER): Payer: BLUE CROSS/BLUE SHIELD | Admitting: Physician Assistant

## 2016-02-11 VITALS — BP 112/74 | HR 82 | Temp 97.8°F | Ht 66.5 in | Wt 286.0 lb

## 2016-02-11 DIAGNOSIS — J0100 Acute maxillary sinusitis, unspecified: Secondary | ICD-10-CM | POA: Diagnosis not present

## 2016-02-11 MED ORDER — AMOXICILLIN 500 MG PO CAPS: 1000.0000 mg | ORAL_CAPSULE | Freq: Two times a day (BID) | ORAL | 0 refills | Status: DC

## 2016-02-11 NOTE — Progress Notes (Signed)
BP 112/74   Pulse 82   Temp 97.8 F (36.6 C) (Oral)   Ht 5' 6.5" (1.689 m)   Wt 286 lb (129.7 kg)   BMI 45.47 kg/m    Subjective:    Patient ID: Tammie Black, female    DOB: 08-Aug-1995, 21 y.o.   MRN: [id number]  HPI: Tammie Black is a 21 y.o. female presenting on 02/11/2016 for Cough; Nasal Congestion; and ear stopped up (Right )  Patient with 5 days worsening congestion, sinus headache.  There is some cough and mild production, denies wheezing.  There has been mild chills, but no high fever.  OTC medications are minimally helpful.  Pressure through the sinuses and sore throat at times from the post nasal drainage.  Mucus is very thick.  Denies blood.  Relevant past medical, surgical, family and social history reviewed and updated as indicated. Allergies and medications reviewed and updated.  Past Medical History:  Diagnosis Date  . Depression   . Migraines     Past Surgical History:  Procedure Laterality Date  . INDUCED ABORTION      Review of Systems  Constitutional: Negative.  Negative for activity change, fatigue and fever.  HENT: Negative.   Eyes: Negative.   Respiratory: Negative.  Negative for cough.   Cardiovascular: Negative.  Negative for chest pain.  Gastrointestinal: Negative.  Negative for abdominal pain.  Endocrine: Negative.   Genitourinary: Negative.  Negative for dysuria.  Musculoskeletal: Negative.   Skin: Negative.   Neurological: Negative.     Allergies as of 02/11/2016   No Known Allergies     Medication List       Accurate as of 02/11/16  8:56 AM. Always use your most recent med list.          amoxicillin 500 MG capsule Commonly known as:  AMOXIL Take 2 capsules (1,000 mg total) by mouth 2 (two) times daily.   baclofen 10 MG tablet Commonly known as:  LIORESAL Take 10 mg by mouth 3 (three) times daily. Reported on 08/01/2015   topiramate 50 MG tablet Commonly known as:  TOPAMAX Take 1/2 daily for 1 week, then take 1/2 twice  daily, then take 1 in morning and 1/2 at night for 1 week, then take 1 twice daily.          Objective:    BP 112/74   Pulse 82   Temp 97.8 F (36.6 C) (Oral)   Ht 5' 6.5" (1.689 m)   Wt 286 lb (129.7 kg)   BMI 45.47 kg/m   No Known Allergies  Physical Exam  Constitutional: She is oriented to person, place, and time. She appears well-developed and well-nourished.  HENT:  Head: Normocephalic and atraumatic.  Right Ear: Tympanic membrane and external ear normal. No middle ear effusion.  Left Ear: Tympanic membrane and external ear normal.  No middle ear effusion.  Nose: Mucosal edema and rhinorrhea present. Right sinus exhibits no maxillary sinus tenderness. Left sinus exhibits no maxillary sinus tenderness.  Mouth/Throat: Uvula is midline. Posterior oropharyngeal erythema present.  Eyes: Conjunctivae and EOM are normal. Pupils are equal, round, and reactive to light. Right eye exhibits no discharge. Left eye exhibits no discharge.  Neck: Normal range of motion.  Cardiovascular: Normal rate, regular rhythm and normal heart sounds.   Pulmonary/Chest: Effort normal and breath sounds normal. No respiratory distress. She has no wheezes.  Abdominal: Soft.  Lymphadenopathy:    She has no cervical adenopathy.  Neurological: She is alert and  oriented to person, place, and time.  Skin: Skin is warm and dry.  Psychiatric: She has a normal mood and affect.        Assessment & Plan:   1. Acute non-recurrent maxillary sinusitis - amoxicillin (AMOXIL) 500 MG capsule; Take 2 capsules (1,000 mg total) by mouth 2 (two) times daily.  Dispense: 40 capsule; Refill: 0   Continue all other maintenance medications as listed above.  Follow up plan: Return if symptoms worsen or fail to improve.  No orders of the defined types were placed in this encounter.   Educational handout given for sinusitis   Remus LofflerAngel S. Wladyslawa Disbro PA-C [facility] [address]  [place],  [place] [address] [phone]   02/11/2016, 8:56 AM

## 2016-02-11 NOTE — Patient Instructions (Signed)

## 2016-02-12 ENCOUNTER — Ambulatory Visit: Payer: BLUE CROSS/BLUE SHIELD | Admitting: Physical Therapy

## 2016-02-12 DIAGNOSIS — M545 Low back pain, unspecified: Secondary | ICD-10-CM

## 2016-02-12 DIAGNOSIS — G8929 Other chronic pain: Secondary | ICD-10-CM

## 2016-02-12 NOTE — Therapy (Addendum)
[facility] [place], [place], [address] Phone: [phone]   Fax:  [phone]  Physical Therapy Treatment  Patient Details  Name: Tammie Black MRN: [id number] Date of Birth: 04-16-1995 Referring Provider: Kenn File MD  Encounter Date: 02/12/2016      PT End of Session - 02/12/16 1146    Visit Number 4   Number of Visits 8   Date for PT Re-Evaluation 03/02/16   PT Start Time 0950   PT Stop Time 1040   PT Time Calculation (min) 50 min   Activity Tolerance Patient tolerated treatment well   Behavior During Therapy [facility] for tasks assessed/performed      Past Medical History:  Diagnosis Date  . Depression   . Migraines     Past Surgical History:  Procedure Laterality Date  . INDUCED ABORTION      There were no vitals filed for this visit.      Subjective Assessment - 02/12/16 1148    Subjective My back feels good today.   Currently in Pain? Yes   Pain Score 1    Pain Location Back   Pain Orientation Right;Left;Mid   Pain Descriptors / Indicators Aching;Throbbing;Sharp   Pain Type Chronic pain   Pain Onset More than a month ago                         [facility] Adult PT Treatment/Exercise - 02/12/16 0001      Exercises   Exercises Knee/Hip     Lumbar Exercises: Aerobic   Stationary Bike Nustep level 3 x 15 minutes.     Modalities   Modalities Ultrasound     Moist Heat Therapy   Number Minutes Moist Heat 20 Minutes   Moist Heat Location Lumbar Spine     Electrical Stimulation   Electrical Stimulation Location Low back.   Electrical Stimulation Action IFC   Electrical Stimulation Parameters 80-150 Hz. x 20 minutes.   Electrical Stimulation Goals Pain     Ultrasound   Ultrasound Location SDLY position.   Ultrasound Parameters U/S at 1.50 W/CM2 x 8 minutes to affected left low back region.                     PT Long Term Goals - 01/20/16 0956      PT LONG  TERM GOAL #1   Title I with HEP   Time 6   Period Weeks   Status New     PT LONG TERM GOAL #2   Title Patient to report decreased frequency of pain in back by 75%   Time 6   Period Weeks   Status New     PT LONG TERM GOAL #3   Title Patient to be able to perform ADLS with 6/65 pain or less.   Time 6   Period Weeks   Status New             Patient will benefit from skilled therapeutic intervention in order to improve the following deficits and impairments:  Pain, Impaired flexibility, Postural dysfunction  Visit Diagnosis: Chronic left-sided low back pain without sciatica     Problem List Patient Active Problem List   Diagnosis Date Noted  . Postpartum endometritis 12/11/2013  . 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 Obesity 06/14/2013   PHYSICAL THERAPY DISCHARGE SUMMARY  Visits from Start of Care: 4.  Current functional level related to goals / functional outcomes: See above.  Remaining deficits: Very low pain-level.   Education / Equipment: HEP. Plan: Patient agrees to discharge.  Patient goals were not met. Patient is being discharged due to not returning since the last visit.  ?????      Olina Melfi, [place] MPT 02/12/2016, 11:53 AM  [facility] [address] [place], [place], [address] Phone: [phone]   Fax:  [phone]  Name: Tammie Black MRN: [id number] Date of Birth: [birth year]

## 2016-02-18 ENCOUNTER — Ambulatory Visit (INDEPENDENT_AMBULATORY_CARE_PROVIDER_SITE_OTHER): Payer: BLUE CROSS/BLUE SHIELD | Admitting: Pediatrics

## 2016-02-18 ENCOUNTER — Encounter: Payer: Self-pay | Admitting: Pediatrics

## 2016-02-18 VITALS — BP 125/79 | HR 76 | Temp 98.3°F | Ht 66.5 in | Wt 289.4 lb

## 2016-02-18 DIAGNOSIS — H6501 Acute serous otitis media, right ear: Secondary | ICD-10-CM

## 2016-02-18 MED ORDER — AZITHROMYCIN 250 MG PO TABS: ORAL_TABLET | ORAL | 0 refills | Status: DC

## 2016-02-18 NOTE — Progress Notes (Signed)
  Subjective:   Patient ID: Tammie Black, female    DOB: [birth year], 21 y.o.   MRN: [id number] CC: Ear Pain (Right pain for 1 week); Cough; and Nasal Congestion  HPI: Tammie Black is a 21 y.o. female presenting for Ear Pain (Right pain for 1 week); Cough; and Nasal Congestion  Seen 7 days ago, given amoxicillin for ear pain, sinus symptoms ongoing then as well Took 500mg  twice a day for several days, then saw she was suppsoed to take 1000mg  BID so increased dose Ear pain has not improved No drainage No fevers Other symptoms better Appetite normal Still slight cough  Relevant past medical, surgical, family and social history reviewed. Allergies and medications reviewed and updated. History  Smoking Status  . Never Smoker  Smokeless Tobacco  . Never Used   ROS: Per HPI   Objective:    BP 125/79   Pulse 76   Temp 98.3 F (36.8 C) (Oral)   Ht 5' 6.5" (1.689 m)   Wt 289 lb 6.4 oz (131.3 kg)   BMI 46.01 kg/m   Wt Readings from Last 3 Encounters:  02/18/16 289 lb 6.4 oz (131.3 kg)  02/11/16 286 lb (129.7 kg)  12/31/15 286 lb 6.4 oz (129.9 kg)    Gen: NAD, alert, cooperative with exam, NCAT EYES: EOMI, no conjunctival injection, or no icterus ENT:  R TM erythematous, clear effusion, canal also slightly erythematou,s no pain with moving auricle LYMPH: +<1cm R side cervical LAD CV: NRRR, normal S1/S2, no murmur, distal pulses 2+ b/l Resp: CTABL, no wheezes, normal WOB Ext: No edema, warm Neuro: Alert and oriented MSK: normal muscle bulk  Assessment & Plan:  Tammie Black was seen today for ear pain, cough and nasal congestion.  Diagnoses and all orders for this visit:  Right acute serous otitis media, recurrence not specified Not improving despite week of amoxicillin, stop amoxicillin, start azithro  Follow up plan: As needed Rex Krasarol Malaia Buchta, MD [facility]

## 2016-02-19 ENCOUNTER — Encounter: Payer: BLUE CROSS/BLUE SHIELD | Admitting: Physical Therapy

## 2016-03-23 ENCOUNTER — Encounter: Payer: Self-pay | Admitting: Women's Health

## 2016-03-23 ENCOUNTER — Ambulatory Visit (INDEPENDENT_AMBULATORY_CARE_PROVIDER_SITE_OTHER): Payer: Self-pay | Admitting: Women's Health

## 2016-03-23 VITALS — BP 120/88 | HR 76 | Ht 67.0 in | Wt 295.0 lb

## 2016-03-23 DIAGNOSIS — B372 Candidiasis of skin and nail: Secondary | ICD-10-CM | POA: Insufficient documentation

## 2016-03-23 NOTE — Patient Instructions (Signed)
Can use over the counter vaginal yeast cream if needed, call if not helping

## 2016-03-23 NOTE — Progress Notes (Signed)
   [facility] Visit  Patient name: Tammie Black Mcnally MRN [id number]  Date of birth: July 27, 1995  CC & HPI:  Tammie Black Vi is a 21 y.o. 361P1001 Caucasian female presenting today for report of itching/irritation outer labia/inner thighs intermittently x 6124yrs, worse in last week or so. Denies abnormal d/c or odor. Hasn't tried anything otc.  No LMP recorded. Patient has had an implant. The current method of family planning is nexplanon. Last pap April 2017, neg  Pertinent History Reviewed:  Medical & Surgical Hx:   Past medical, surgical, family, and social history reviewed in electronic medical record Medications: Reviewed & Updated - see associated section Allergies: Reviewed in electronic medical record  Objective Findings:  Vitals: BP 120/88 (BP Location: Right Arm, Patient Position: Sitting, Cuff Size: Large)   Pulse 76   Ht 5\' 7"  (1.702 m)   Wt 295 lb (133.8 kg)   BMI 46.20 kg/m  Body mass index is 46.2 kg/m.  Physical Examination: General appearance - alert, well appearing, and in no distress Bilateral groin folds- +yeast and onto mons and down bilateral labia majora- all painted w/ gentian violet Spec: cx clear, normal nonodorous d/c  No results found for this or any previous visit (from the past 24 hour(s)).   Assessment & Plan:  A:   Intertrigo candida  P:  Can use otc yeast cream if needed after gentian violet wears off, if not helping, call to let us know  Return for april for physical.  Marge DuncansBooker, Kimberly Randall CNM, [facility]NP[facility]BC 03/23/2016 2:49 PM

## 2016-05-20 ENCOUNTER — Other Ambulatory Visit: Payer: Medicaid Other | Admitting: Women's Health

## 2016-05-21 ENCOUNTER — Ambulatory Visit (INDEPENDENT_AMBULATORY_CARE_PROVIDER_SITE_OTHER): Payer: Medicaid Other | Admitting: Women's Health

## 2016-05-21 ENCOUNTER — Encounter: Payer: Self-pay | Admitting: Women's Health

## 2016-05-21 VITALS — BP 104/64 | HR 67 | Ht 66.0 in | Wt 293.0 lb

## 2016-05-21 DIAGNOSIS — B372 Candidiasis of skin and nail: Secondary | ICD-10-CM

## 2016-05-21 DIAGNOSIS — Z01419 Encounter for gynecological examination (general) (routine) without abnormal findings: Secondary | ICD-10-CM

## 2016-05-21 DIAGNOSIS — Z308 Encounter for other contraceptive management: Secondary | ICD-10-CM | POA: Diagnosis not present

## 2016-05-21 DIAGNOSIS — Z113 Encounter for screening for infections with a predominantly sexual mode of transmission: Secondary | ICD-10-CM

## 2016-05-21 MED ORDER — NYSTATIN 100000 UNIT/GM EX CREA: 1.0000 "application " | TOPICAL_CREAM | Freq: Two times a day (BID) | CUTANEOUS | 1 refills | Status: DC

## 2016-05-21 NOTE — Progress Notes (Signed)
Subjective:   Tammie Black is a 21 y.o. G61P1011 Caucasian female here for a routine well-woman exam.  Patient's last menstrual period was 05/13/2016 (approximate).    Current complaints: yeast in inner leg creases, we painted w/ gentian violet last visit- helped for awhile then came back.  PCP: Ignacia Bayley       Does not desire labs  Social History: Sexual: heterosexual Marital Status: married Living situation: with spouse Occupation: Chartered loss adjuster @ [employer] Tobacco/alcohol: none Illicit drugs: no history of illicit drug use  The following portions of the patient's history were reviewed and updated as appropriate: allergies, current medications, past family history, past medical history, past social history, past surgical history and problem list.  Past Medical History Past Medical History:  Diagnosis Date  . Depression   . Migraines     Past Surgical History Past Surgical History:  Procedure Laterality Date  . INDUCED ABORTION      Gynecologic History G2P1011  Patient's last menstrual period was 05/13/2016 (approximate). Contraception: Nexplanon placed Oct 2015 in hospital Last Pap: 05/2015. Results were: normal Last mammogram: never. Results were: n/a Last TCS: never  Obstetric History OB History  Gravida Para Term Preterm AB Living  SAB TAB Ectopic Multiple Live Births          1    # Outcome Date GA Lbr Len/2nd Weight Sex Delivery Anes PTL Lv  2 Term 11/27/13 [redacted]w[redacted]d 15:24 / 04:31  M Vag-Spont EPI  LIV  1 AB               Current Medications Current Outpatient Prescriptions on File Prior to Visit  Medication Sig Dispense Refill  . topiramate (TOPAMAX) 50 MG tablet Take 1/2 daily for 1 week, then take 1/2 twice daily, then take 1 in morning and 1/2 at night for 1 week, then take 1 twice daily. 60 tablet 2   No current facility-administered medications on file prior to visit.     Review of Systems Patient denies any headaches,  blurred vision, shortness of breath, chest pain, abdominal pain, problems with bowel movements, urination, or intercourse.  Objective:  BP 104/64 (BP Location: Left Arm, Patient Position: Sitting, Cuff Size: Large)   Pulse 67   Ht  (1.676 m)   Wt 293 lb (132.9 kg)   LMP 05/13/2016 (Approximate)   BMI 47.29 kg/m  Physical Exam  General:  Well developed, well nourished, no acute distress. She is alert and oriented x3. Skin:  Warm and dry, + intertrigal candida bilateral inner legs, painted w/ gentian violet Neck:  Midline trachea, no thyromegaly or nodules Cardiovascular: Regular rate and rhythm, no murmur heard Lungs:  Effort normal, all lung fields clear to auscultation bilaterally Breasts:  No dominant palpable mass, retraction, or nipple discharge Abdomen:  Soft, non tender, no hepatosplenomegaly or masses Pelvic:  External genitalia is normal in appearance.  The vagina is normal in appearance. The cervix is bulbous, no CMT.  Thin prep pap is not done. Uterus is felt to be normal size, shape, and contour.  No adnexal masses or tenderness noted. Extremities:  No swelling or varicosities noted Psych:  She has a normal mood and affect  Assessment:   Healthy well-woman exam Candidal intertrigo  Plan:  Send gc/ct Rx nystatin cream BID to inner legs F/U Oct for nexplanon removal, or sooner if needed Mammogram  or sooner if problems Colonoscopy  or sooner if problems  Shawna Clamp  [place], WHNP-BC 05/21/2016 11:59 AM

## 2016-05-24 LAB — GC/CHLAMYDIA PROBE AMP
Chlamydia trachomatis, NAA: NEGATIVE
NEISSERIA GONORRHOEAE BY PCR: NEGATIVE

## 2016-08-24 ENCOUNTER — Ambulatory Visit (INDEPENDENT_AMBULATORY_CARE_PROVIDER_SITE_OTHER): Payer: Self-pay | Admitting: Family

## 2016-08-24 ENCOUNTER — Encounter: Payer: Self-pay | Admitting: Family

## 2016-08-24 VITALS — BP 134/99 | HR 83 | Temp 97.3°F | Ht 66.0 in | Wt 306.0 lb

## 2016-08-24 DIAGNOSIS — S70361A Insect bite (nonvenomous), right thigh, initial encounter: Secondary | ICD-10-CM

## 2016-08-24 DIAGNOSIS — W57XXXA Bitten or stung by nonvenomous insect and other nonvenomous arthropods, initial encounter: Secondary | ICD-10-CM

## 2016-08-24 MED ORDER — DOXYCYCLINE HYCLATE 100 MG PO TABS: 100.0000 mg | ORAL_TABLET | Freq: Two times a day (BID) | ORAL | 0 refills | Status: DC

## 2016-08-24 NOTE — Progress Notes (Addendum)
   Subjective:    Patient ID: Tammie Black, female    DOB: 01/10/1996, 21 y.o.   MRN: [id number]  HPI Pt presents to the office today with complaints of a "bug bite" on right thigh that she noticed several days ago, but has become bigger and worse. Denies any itching at this time, pain, but did have initial itching, fever, or discharge. PT is unsure what bite her, but noticed the erythemas.    Review of Systems  Skin: Positive for rash.  All other systems reviewed and are negative.      Objective:   Physical Exam  Constitutional: She is oriented to person, place, and time. She appears well-developed and well-nourished. No distress.  HENT:  Head: Normocephalic.  Cardiovascular: Normal rate, regular rhythm, normal heart sounds and intact distal pulses.   No murmur heard. Pulmonary/Chest: Effort normal and breath sounds normal. No respiratory distress. She has no wheezes.  Musculoskeletal: Normal range of motion. She exhibits no edema or tenderness.  Neurological: She is alert and oriented to person, place, and time.  Skin: Skin is warm and dry. Rash noted.  Erythemas circular patch approx 4cmX3cm, scattered ecchymosis present around  Psychiatric: She has a normal mood and affect. Her behavior is normal. Judgment and thought content normal.  Vitals reviewed.       BP (!) 134/99   Pulse 83   Temp (!) 97.3 F (36.3 C) (Oral)   Ht 5\' 6"  (1.676 m)   Wt (!) 306 lb (138.8 kg)   BMI 49.39 kg/m      Assessment & Plan:  1. Insect bite, initial encounter Do not scratch Apply hydrocortisone cream OTC Will do lab work to rule out RMSF and Lyme Report any fevers, joint pain, or new rash RTO prn  - Rocky mtn spotted fvr abs pnl(IgG+IgM) - Lyme Ab/Western Blot Reflex - doxycycline (VIBRA-TABS) 100 MG tablet; Take 1 tablet (100 mg total) by mouth 2 (two) times daily.  Dispense: 20 tablet; Refill: 0    Jannifer Rodneyhristy Hawks, FNP

## 2016-08-24 NOTE — Patient Instructions (Signed)
Insect Bite, Adult An insect bite can make your skin red, itchy, and swollen. An insect bite is different from an insect sting, which happens when an insect injects poison (venom) into the skin. Some insects can spread disease to people through a bite. However, most insect bites do not lead to disease and are not serious. What are the causes? Insects may bite for a variety of reasons, including:  Hunger.  To defend themselves.  Insects that bite include:  Spiders.  Mosquitoes.  Ticks.  Fleas.  Ants.  Flies.  Bedbugs.  What are the signs or symptoms? Symptoms of this condition include:  Itching or pain in the bite area.  Redness and swelling in the bite area.  An open wound (skin ulcer).  In many cases, symptoms last for 2-4 days. How is this diagnosed? This condition is usually diagnosed based on symptoms and a physical exam. How is this treated? Treatment is usually not needed. Symptoms often go away on their own. When treatment is recommended, it may involve:  Applying a cream or lotion to the bitten area. This treatment helps with itching.  Taking an antibiotic medicine. This treatment is needed if the bite area gets infected.  Getting a tetanus shot.  Applying ice to the affected area.  Medicines called antihistamines. This treatment is needed if you develop an allergic reaction to the insect bite.  Follow these instructions at home: Bite area care  Do not scratch the bite area.  Keep the bite area clean and dry. Wash it every day with soap and water as told by your health care provider.  Check the bite area every day for signs of infection. Check for: ? More redness, swelling, or pain. ? Fluid or blood. ? Warmth. ? Pus. Managing pain, itching, and swelling   You may apply a baking soda paste, cortisone cream, or calamine lotion to the bite area as told by your health care provider.  If directed, applyice to the bite area. ? Put ice in a  plastic bag. ? Place a towel between your skin and the bag. ? Leave the ice on for 20 minutes, 2-3 times per day. Medicines  Apply or take over-the-counter and prescription medicines only as told by your health care provider.  If you were prescribed an antibiotic medicine, use it as told by your health care provider. Do not stop using the antibiotic even if your condition improves. General instructions  Keep all follow-up visits as told by your health care provider. This is important. How is this prevented? To help reduce your risk of insect bites:  When you are outdoors, wear clothing that covers your arms and legs.  Use insect repellent. The best insect repellents contain: ? DEET, picaridin, oil of lemon eucalyptus (OLE), or IR3535. ? Higher amounts of an active ingredient.  If your home windows do not have screens, consider installing them.  Contact a health care provider if:  You have more redness, swelling, or pain in the bite area.  You have fluid, blood, or pus coming from the bite area.  The bite area feels warm to the touch.  You have a fever. Get help right away if:  You have joint pain.  You have a rash.  You have shortness of breath.  You feel unusually tired or sleepy.  You have neck pain.  You have a headache.  You have unusual weakness.  You have chest pain.  You have nausea, vomiting, or pain in the abdomen. This   information is not intended to replace advice given to you by your health care provider. Make sure you discuss any questions you have with your health care provider. Document Released: 02/27/2004 Document Revised: 09/18/2015 Document Reviewed: 07/29/2015 Elsevier Interactive Patient Education  2018 Elsevier Inc.  

## 2016-08-27 LAB — LYME AB/WESTERN BLOT REFLEX: Lyme IgG/IgM Ab: <0.91 {ISR} (ref 0.00–0.90)

## 2016-08-27 LAB — ROCKY MTN SPOTTED FVR ABS PNL(IGG+IGM)
RMSF IgG: NEGATIVE
RMSF IgM: 0.48 index (ref 0.00–0.89)

## 2016-11-09 ENCOUNTER — Ambulatory Visit (INDEPENDENT_AMBULATORY_CARE_PROVIDER_SITE_OTHER): Payer: BLUE CROSS/BLUE SHIELD | Admitting: Women's Health

## 2016-11-09 ENCOUNTER — Encounter: Payer: Self-pay | Admitting: Women's Health

## 2016-11-09 ENCOUNTER — Encounter (INDEPENDENT_AMBULATORY_CARE_PROVIDER_SITE_OTHER): Payer: Self-pay

## 2016-11-09 VITALS — BP 120/90 | HR 76 | Wt 309.0 lb

## 2016-11-09 DIAGNOSIS — Z3046 Encounter for surveillance of implantable subdermal contraceptive: Secondary | ICD-10-CM | POA: Diagnosis not present

## 2016-11-09 NOTE — Progress Notes (Signed)
   [facility] Nexplanon Removal  Patient name: Tammie Black MRN [id number]  Date of birth: August 13, 1995 CC & HPI:  Tammie Black is a 21 y.o. G62P1011 Caucasian female being seen today for removal of a Nexplanon. Her Nexplanon was placed Oct 2015.  She desires removal because it's time, and she desires pregnancy.   Patient's last menstrual period was 11/07/2016. Last pap4/17/17. Results were:  normal The planned method of family planning is none Signed copy of informed consent in chart.  Pertinent History Reviewed:  Reviewed past medical,surgical, social and family history.  Reviewed problem list, medications and allergies. Objective Findings:   Vitals:   11/09/16 0843  BP: 120/90  Pulse: 76  Weight: (!) 309 lb (140.2 kg)    Body mass index is 49.87 kg/m.  No results found for this or any previous visit (from the past 24 hour(s)).   Time out was performed.  Nexplanon site identified.  Area prepped in usual sterile fashon. One cc of 2% lidocaine was used to anesthetize the area at the distal end of the implant. A small stab incision was made right beside the implant on the distal portion.  The Nexplanon rod was grasped using hemostats and removed without difficulty.  There was less than 3 cc blood loss. There were no complications.  Steri-strips were applied over the small incision and a pressure bandage was applied.  The patient tolerated the procedure well. Assessment & Plan:  1) Nexplanon removal She was instructed to keep the area clean and dry, remove pressure bandage in 24 hours, and keep insertion site covered with the steri-strip for 3-5 days.   Follow-up PRN problems. 2) Desires pregnancy> start pnv today, let us know when pregnant  Return for April for physical.  Marge Duncans CNM, [facility] 11/09/2016 9:17 AM

## 2016-11-09 NOTE — Patient Instructions (Addendum)
Keep the area clean and dry.  You can remove the big bandage in 24 hours, and the small steri-strip bandage in 3-5 days.  If you have any concerns, please give [place] a call.    Start prenatal vitamins today

## 2017-05-10 ENCOUNTER — Encounter: Payer: Self-pay | Admitting: Family Medicine

## 2017-05-10 ENCOUNTER — Ambulatory Visit (INDEPENDENT_AMBULATORY_CARE_PROVIDER_SITE_OTHER): Payer: BLUE CROSS/BLUE SHIELD | Admitting: Family Medicine

## 2017-05-10 VITALS — BP 136/88 | HR 89 | Temp 97.7°F | Ht 66.0 in | Wt 303.0 lb

## 2017-05-10 DIAGNOSIS — N912 Amenorrhea, unspecified: Secondary | ICD-10-CM

## 2017-05-10 DIAGNOSIS — Z3201 Encounter for pregnancy test, result positive: Secondary | ICD-10-CM

## 2017-05-10 LAB — PREGNANCY, URINE: PREG TEST UR: POSITIVE — AB

## 2017-05-10 NOTE — Progress Notes (Signed)
Subjective: CC: ?pregnancy PCP: Bennie PieriniMartin, Mary-Margaret, FNP ZOX:WRUEAVHPI:Tammie Black is a 22 y.o. female presenting to clinic today for:  1. Concern for pregnancy Patient reports that she took an at-home pregnancy test recently and this was positive.  She thinks her last menstrual period was 03/05/2017 but notes that she had no menstrual period in the month of January.  She had her Nexplanon removed last winter.  Pregnancy is a planned pregnancy.  She is a G2P1011.  Not currently on any medications.  She started prenatal vitamin over-the-counter recently.  She has a 23-year-old son at home and reports that he is in good health.  [facility] for obstetric care.  Past medical history negative for gestational diabetes or preeclampsia.  ROS: Per HPI  No Known Allergies Past Medical History:  Diagnosis Date  . Depression   . Migraines     Current Outpatient Medications:  .  doxycycline (VIBRA-TABS) 100 MG tablet, Take 1 tablet (100 mg total) by mouth 2 (two) times daily. (Patient not taking: Reported on 11/09/2016), Disp: 20 tablet, Rfl: 0 .  etonogestrel (NEXPLANON) 68 MG IMPL implant, 1 each by Subdermal route once., Disp: , Rfl:  .  nystatin cream (MYCOSTATIN), Apply 1 application topically 2 (two) times daily. (Patient not taking: Reported on 11/09/2016), Disp: 30 g, Rfl: 1 .  topiramate (TOPAMAX) 50 MG tablet, Take 1/2 daily for 1 week, then take 1/2 twice daily, then take 1 in morning and 1/2 at night for 1 week, then take 1 twice daily. (Patient not taking: Reported on 08/24/2016), Disp: 60 tablet, Rfl: 2 Social History   Socioeconomic History  . Marital status: Married    Spouse name: Not on file  . Number of children: Not on file  . Years of education: Not on file  . Highest education level: Not on file  Occupational History  . Not on file  Social Needs  . Financial resource strain: Not on file  . Food insecurity:   Worry: Not on file    Inability: Not on file  . Transportation needs:    Medical: Not on file    Non-medical: Not on file  Tobacco Use  . Smoking status: Never Smoker  . Smokeless tobacco: Never Used  Substance and Sexual Activity  . Alcohol use: No  . Drug use: No  . Sexual activity: Yes    Birth control/protection: Implant  Lifestyle  . Physical activity:    Days per week: Not on file    Minutes per session: Not on file  . Stress: Not on file  Relationships  . Social connections:    Talks on phone: Not on file    Gets together: Not on file    Attends religious service: Not on file    Active member of club or organization: Not on file    Attends meetings of clubs or organizations: Not on file    Relationship status: Not on file  . Intimate partner violence:    Fear of current or ex partner: Not on file    Emotionally abused: Not on file    Physically abused: Not on file    Forced sexual activity: Not on file  Other Topics Concern  . Not on file  Social History Narrative  . Not on file   Family History  Problem Relation Age of Onset  . Deafness Mother   . Deafness Father   .  Diabetes Paternal Grandfather     Objective: Office vital signs reviewed. BP 136/88   Pulse 89   Temp 97.7 F (36.5 C) (Oral)   Ht 5\' 6"  (1.676 m)   Wt (!) 303 lb (137.4 kg)   LMP 03/05/2017   BMI 48.91 kg/m   Physical Examination:  General: Awake, alert, well nourished, No acute distress HEENT: Normal    Eyes: PERRLA, extraocular membranes intact, sclera white    Throat: moist mucus membranes.  Airway is patent Cardio: regular rate Pulm: no wheeze; normal work of breathing on room air  Results for orders placed or performed in visit on 05/10/17 (from the past 24 hour(s))  Pregnancy, urine     Status: Abnormal   Collection Time: 05/10/17  3:11 PM  Result Value Ref Range   Preg Test, Ur Positive (A) Negative   Narrative   Performed at:  [facility] [address], [place], [place]  [id number] Lab Director: Rockie Neighbours [facility], Phone:  [phone]   Assessment/ Plan: 22 y.o. female   1. Positive pregnancy test Urine pregnancy positive here in office.  She is currently on a prenatal vitamin.  Blood pressure within normal limits.  Will likely need a dating ultrasound given unsure history of last menstrual period.  I will defer this to [facility].  Continue prenatal vitamin.  Encourage p.o. hydration.  Avoid NSAIDs and other medications not intended during pregnancy.  2. Absent menses - Pregnancy, urine   Orders Placed This Encounter  Procedures  . Pregnancy, urine  . Ambulatory referral to Obstetrics / Gynecology    Referral Priority:   Routine    Referral Type:   Consultation    Referral Reason:   Specialty Services Required    Requested Specialty:   Obstetrics and Gynecology    Number of Visits Requested:   1    Ashly Hulen Skains, DO [facility] [phone]

## 2017-05-11 ENCOUNTER — Other Ambulatory Visit: Payer: Self-pay | Admitting: Obstetrics and Gynecology

## 2017-05-11 DIAGNOSIS — O3680X0 Pregnancy with inconclusive fetal viability, not applicable or unspecified: Secondary | ICD-10-CM

## 2017-05-12 ENCOUNTER — Other Ambulatory Visit: Payer: BLUE CROSS/BLUE SHIELD

## 2017-05-12 ENCOUNTER — Ambulatory Visit (INDEPENDENT_AMBULATORY_CARE_PROVIDER_SITE_OTHER): Payer: BLUE CROSS/BLUE SHIELD

## 2017-05-12 ENCOUNTER — Other Ambulatory Visit: Payer: Self-pay | Admitting: Obstetrics and Gynecology

## 2017-05-12 DIAGNOSIS — O3680X0 Pregnancy with inconclusive fetal viability, not applicable or unspecified: Secondary | ICD-10-CM

## 2017-05-12 DIAGNOSIS — Z3A01 Less than 8 weeks gestation of pregnancy: Secondary | ICD-10-CM | POA: Diagnosis not present

## 2017-05-12 NOTE — Progress Notes (Signed)
US ? 5+1 wks GS,unable to visualize YS or fetal pole,GS 4 mm,normal ovaries bilat,small amount of simple cul de sac fluid,pt is having labs done today,per DIRECTVJennifer

## 2017-05-13 ENCOUNTER — Telehealth: Payer: Self-pay | Admitting: Adult Health

## 2017-05-13 DIAGNOSIS — Z3490 Encounter for supervision of normal pregnancy, unspecified, unspecified trimester: Secondary | ICD-10-CM

## 2017-05-13 LAB — BETA HCG QUANT (REF LAB): HCG QUANT: 847 m[IU]/mL

## 2017-05-13 NOTE — Telephone Encounter (Signed)
Pt aware of labs, will recheck 4/12 to see if doubling, and will talk on Monday and schedule F/U US then

## 2017-05-15 LAB — BETA HCG QUANT (REF LAB): HCG QUANT: 1630 m[IU]/mL

## 2017-05-17 ENCOUNTER — Telehealth: Payer: Self-pay | Admitting: Adult Health

## 2017-05-17 NOTE — Telephone Encounter (Signed)
Pt aware QHCG doubled, will make US appt for next week

## 2017-05-20 ENCOUNTER — Other Ambulatory Visit: Payer: Self-pay | Admitting: Adult Health

## 2017-05-20 DIAGNOSIS — O3680X0 Pregnancy with inconclusive fetal viability, not applicable or unspecified: Secondary | ICD-10-CM

## 2017-05-24 ENCOUNTER — Ambulatory Visit (INDEPENDENT_AMBULATORY_CARE_PROVIDER_SITE_OTHER): Payer: BLUE CROSS/BLUE SHIELD

## 2017-05-24 DIAGNOSIS — Z3A01 Less than 8 weeks gestation of pregnancy: Secondary | ICD-10-CM | POA: Diagnosis not present

## 2017-05-24 DIAGNOSIS — O3680X0 Pregnancy with inconclusive fetal viability, not applicable or unspecified: Secondary | ICD-10-CM | POA: Diagnosis not present

## 2017-05-24 NOTE — Progress Notes (Signed)
US 6+3 wks,single IUP w/ys,positive fht 120 bpm,normal ovaries bilat,crl 6.08 mm

## 2017-05-26 ENCOUNTER — Encounter: Payer: Self-pay | Admitting: Family Medicine

## 2017-05-26 ENCOUNTER — Ambulatory Visit (INDEPENDENT_AMBULATORY_CARE_PROVIDER_SITE_OTHER): Payer: BLUE CROSS/BLUE SHIELD | Admitting: Family Medicine

## 2017-05-26 VITALS — BP 119/89 | HR 79 | Temp 98.7°F | Ht 66.0 in | Wt 301.4 lb

## 2017-05-26 DIAGNOSIS — H66009 Acute suppurative otitis media without spontaneous rupture of ear drum, unspecified ear: Secondary | ICD-10-CM

## 2017-05-26 DIAGNOSIS — J0100 Acute maxillary sinusitis, unspecified: Secondary | ICD-10-CM | POA: Diagnosis not present

## 2017-05-26 MED ORDER — CEFUROXIME AXETIL 500 MG PO TABS: 500.0000 mg | ORAL_TABLET | Freq: Two times a day (BID) | ORAL | 0 refills | Status: DC

## 2017-05-26 NOTE — Progress Notes (Signed)
Chief Complaint  Patient presents with  . Sinus Problem    pt here today c/o cough, congestion, ears stopped up     HPI  Patient presents today for Patient presents with upper respiratory congestion.R ear pain No Rhinorrhea There is moderate sore throat. Patient reports coughing frequently as well.  No sputum noted. There is no fever, chills or sweats. The patient denies being short of breath. Onset was 6-7 days ago. Gradually worsening. [redacted] weeks pregnant. PMH: Smoking status noted ROS: Per HPI  Objective: BP 119/89   Pulse 79   Temp 98.7 F (37.1 C) (Oral)   Ht 5\' 6"  (1.676 m)   Wt (!) 301 lb 6 oz (136.7 kg)   LMP 03/05/2017 (Approximate)   BMI 48.64 kg/m  Gen: NAD, alert, cooperative with exam HEENT: NCAT, Nasal passages swollen, red Right TM RED CV: RRR, good S1/S2, no murmur Resp: CTA Ext: No edema, warm Neuro: Alert and oriented, No gross deficits  Assessment and plan:  1. Acute suppurative otitis media without spontaneous rupture of ear drum, recurrence not specified, unspecified laterality   2. Acute maxillary sinusitis, recurrence not specified     Meds ordered this encounter  Medications  . cefUROXime (CEFTIN) 500 MG tablet    Sig: Take 1 tablet (500 mg total) by mouth 2 (two) times daily with a meal.    Dispense:  20 tablet    Refill:  0    No orders of the defined types were placed in this encounter.   Follow up as needed.  Mechele ClaudeWarren Ranveer Wahlstrom, MD

## 2017-06-08 ENCOUNTER — Ambulatory Visit: Payer: BLUE CROSS/BLUE SHIELD | Admitting: *Deleted

## 2017-06-08 ENCOUNTER — Encounter: Payer: Self-pay | Admitting: Advanced Practice Midwife

## 2017-06-08 ENCOUNTER — Ambulatory Visit (INDEPENDENT_AMBULATORY_CARE_PROVIDER_SITE_OTHER): Payer: BLUE CROSS/BLUE SHIELD | Admitting: Advanced Practice Midwife

## 2017-06-08 VITALS — BP 98/78 | HR 78 | Wt 305.0 lb

## 2017-06-08 DIAGNOSIS — Z3A08 8 weeks gestation of pregnancy: Secondary | ICD-10-CM | POA: Diagnosis not present

## 2017-06-08 DIAGNOSIS — Z1389 Encounter for screening for other disorder: Secondary | ICD-10-CM

## 2017-06-08 DIAGNOSIS — O99341 Other mental disorders complicating pregnancy, first trimester: Secondary | ICD-10-CM

## 2017-06-08 DIAGNOSIS — Z3490 Encounter for supervision of normal pregnancy, unspecified, unspecified trimester: Secondary | ICD-10-CM | POA: Insufficient documentation

## 2017-06-08 DIAGNOSIS — R110 Nausea: Secondary | ICD-10-CM | POA: Diagnosis not present

## 2017-06-08 DIAGNOSIS — O9989 Other specified diseases and conditions complicating pregnancy, childbirth and the puerperium: Secondary | ICD-10-CM | POA: Diagnosis not present

## 2017-06-08 DIAGNOSIS — Z3481 Encounter for supervision of other normal pregnancy, first trimester: Secondary | ICD-10-CM

## 2017-06-08 DIAGNOSIS — F329 Major depressive disorder, single episode, unspecified: Secondary | ICD-10-CM | POA: Diagnosis not present

## 2017-06-08 DIAGNOSIS — Z331 Pregnant state, incidental: Secondary | ICD-10-CM

## 2017-06-08 DIAGNOSIS — Z3682 Encounter for antenatal screening for nuchal translucency: Secondary | ICD-10-CM

## 2017-06-08 LAB — POCT URINALYSIS DIPSTICK
Blood, UA: NEGATIVE
GLUCOSE UA: NEGATIVE
Leukocytes, UA: NEGATIVE
Nitrite, UA: NEGATIVE
Protein, UA: NEGATIVE

## 2017-06-08 NOTE — Progress Notes (Signed)
  Subjective:    Tammie Black is a G3P1011 [redacted]w[redacted]d being seen today for her first obstetrical visit.  Her obstetrical history is significant for term SVD w/o problems.  Pregnancy history fully reviewed.  Patient reports only mild nausea, declines meds. Doing ok w/depression, declines referral.  Vitals:   06/08/17 1022  BP: 98/78  Pulse: 78  Weight: (!) 305 lb (138.3 kg)    HISTORY: OB History  Gravida Para Term Preterm AB Living  SAB TAB Ectopic Multiple Live Births          1    # Outcome Date GA Lbr Len/2nd Weight Sex Delivery Anes PTL Lv  3 Current           2 Term 11/27/13 [redacted]w[redacted]d 15:24 / 04:31 8 lb 7 oz (3.827 kg) M Vag-Spont EPI N LIV  1 AB 2013           Past Medical History:  Diagnosis Date  . Depression   . Migraines    Past Surgical History:  Procedure Laterality Date  . INDUCED ABORTION     Family History  Problem Relation Age of Onset  . Deafness Mother   . Deafness Father   . Diabetes Paternal Grandfather      Exam                                      System:     Skin: normal coloration and turgor, no rashes    Neurologic: oriented, normal, normal mood   Extremities: normal strength, tone, and muscle mass   HEENT PERRLA   Mouth/Teeth mucous membranes moist, normal dentition   Neck supple and no masses   Cardiovascular: regular rate and rhythm   Respiratory:  appears well, vitals normal, no respiratory distress, acyanotic   Abdomen: soft, non-tender;  FHR: 160 [place]        The nature of [facility] with multiple MDs and other Advanced Practice Providers was explained to patient; also emphasized that residents, students are part of our team.  Assessment:    Pregnancy: G3P1011 Patient Active Problem List   Diagnosis Date Noted  . Supervision of normal pregnancy 06/08/2017  . Candidal intertrigo 03/23/2016  . Postpartum endometritis 12/11/2013  . 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 Obesity  06/14/2013        Plan:     Initial labs drawn. Continue prenatal vitamins  Problem list reviewed and updated  Reviewed n/v relief measures and warning s/s to report  Reviewed recommended weight gain based on pre-gravid BMI  Encouraged well-balanced diet Genetic Screening discussed Integrated Screen: requested.  Ultrasound discussed; fetal survey: requested.  Return in about 1 month (around 07/08/2017) for US:NT+1st IT, LROB.  Tammie Black 06/08/2017

## 2017-06-08 NOTE — Patient Instructions (Signed)
 First Trimester of Pregnancy The first trimester of pregnancy is from week 1 until the end of week 12 (months 1 through 3). A week after a sperm fertilizes an egg, the egg will implant on the wall of the uterus. This embryo will begin to develop into a baby. Genes from you and your partner are forming the baby. The female genes determine whether the baby is a boy or a girl. At 6-8 weeks, the eyes and face are formed, and the heartbeat can be seen on ultrasound. At the end of 12 weeks, all the baby's organs are formed.  Now that you are pregnant, you will want to do everything you can to have a healthy baby. Two of the most important things are to get good prenatal care and to follow your health care provider's instructions. Prenatal care is all the medical care you receive before the baby's birth. This care will help prevent, find, and treat any problems during the pregnancy and childbirth. BODY CHANGES Your body goes through many changes during pregnancy. The changes vary from woman to woman.   You may gain or lose a couple of pounds at first.  You may feel sick to your stomach (nauseous) and throw up (vomit). If the vomiting is uncontrollable, call your health care provider.  You may tire easily.  You may develop headaches that can be relieved by medicines approved by your health care provider.  You may urinate more often. Painful urination may mean you have a bladder infection.  You may develop heartburn as a result of your pregnancy.  You may develop constipation because certain hormones are causing the muscles that push waste through your intestines to slow down.  You may develop hemorrhoids or swollen, bulging veins (varicose veins).  Your breasts may begin to grow larger and become tender. Your nipples may stick out more, and the tissue that surrounds them (areola) may become darker.  Your gums may bleed and may be sensitive to brushing and flossing.  Dark spots or blotches  (chloasma, mask of pregnancy) may develop on your face. This will likely fade after the baby is born.  Your menstrual periods will stop.  You may have a loss of appetite.  You may develop cravings for certain kinds of food.  You may have changes in your emotions from day to day, such as being excited to be pregnant or being concerned that something may go wrong with the pregnancy and baby.  You may have more vivid and strange dreams.  You may have changes in your hair. These can include thickening of your hair, rapid growth, and changes in texture. Some women also have hair loss during or after pregnancy, or hair that feels dry or thin. Your hair will most likely return to normal after your baby is born. WHAT TO EXPECT AT YOUR PRENATAL VISITS During a routine prenatal visit:  You will be weighed to make sure you and the baby are growing normally.  Your blood pressure will be taken.  Your abdomen will be measured to track your baby's growth.  The fetal heartbeat will be listened to starting around week 10 or 12 of your pregnancy.  Test results from any previous visits will be discussed. Your health care provider may ask you:  How you are feeling.  If you are feeling the baby move.  If you have had any abnormal symptoms, such as leaking fluid, bleeding, severe headaches, or abdominal cramping.  If you have any questions. Other   tests that may be performed during your first trimester include:  Blood tests to find your blood type and to check for the presence of any previous infections. They will also be used to check for low iron levels (anemia) and Rh antibodies. Later in the pregnancy, blood tests for diabetes will be done along with other tests if problems develop.  Urine tests to check for infections, diabetes, or protein in the urine.  An ultrasound to confirm the proper growth and development of the baby.  An amniocentesis to check for possible genetic problems.  Fetal  screens for spina bifida and Down syndrome.  You may need other tests to make sure you and the baby are doing well. HOME CARE INSTRUCTIONS  Medicines  Follow your health care provider's instructions regarding medicine use. Specific medicines may be either safe or unsafe to take during pregnancy.  Take your prenatal vitamins as directed.  If you develop constipation, try taking a stool softener if your health care provider approves. Diet  Eat regular, well-balanced meals. Choose a variety of foods, such as meat or vegetable-based protein, fish, milk and low-fat dairy products, vegetables, fruits, and whole grain breads and cereals. Your health care provider will help you determine the amount of weight gain that is right for you.  Avoid raw meat and uncooked cheese. These carry germs that can cause birth defects in the baby.  Eating four or five small meals rather than three large meals a day may help relieve nausea and vomiting. If you start to feel nauseous, eating a few soda crackers can be helpful. Drinking liquids between meals instead of during meals also seems to help nausea and vomiting.  If you develop constipation, eat more high-fiber foods, such as fresh vegetables or fruit and whole grains. Drink enough fluids to keep your urine clear or pale yellow. Activity and Exercise  Exercise only as directed by your health care provider. Exercising will help you:  Control your weight.  Stay in shape.  Be prepared for labor and delivery.  Experiencing pain or cramping in the lower abdomen or low back is a good sign that you should stop exercising. Check with your health care provider before continuing normal exercises.  Try to avoid standing for long periods of time. Move your legs often if you must stand in one place for a long time.  Avoid heavy lifting.  Wear low-heeled shoes, and practice good posture.  You may continue to have sex unless your health care provider directs you  otherwise. Relief of Pain or Discomfort  Wear a good support bra for breast tenderness.   Take warm sitz baths to soothe any pain or discomfort caused by hemorrhoids. Use hemorrhoid cream if your health care provider approves.   Rest with your legs elevated if you have leg cramps or low back pain.  If you develop varicose veins in your legs, wear support hose. Elevate your feet for 15 minutes, 3-4 times a day. Limit salt in your diet. Prenatal Care  Schedule your prenatal visits by the twelfth week of pregnancy. They are usually scheduled monthly at first, then more often in the last 2 months before delivery.  Write down your questions. Take them to your prenatal visits.  Keep all your prenatal visits as directed by your health care provider. Safety  Wear your seat belt at all times when driving.  Make a list of emergency phone numbers, including numbers for family, friends, the hospital, and police and fire departments. General   Tips  Ask your health care provider for a referral to a local prenatal education class. Begin classes no later than at the beginning of month 6 of your pregnancy.  Ask for help if you have counseling or nutritional needs during pregnancy. Your health care provider can offer advice or refer you to specialists for help with various needs.  Do not use hot tubs, steam rooms, or saunas.  Do not douche or use tampons or scented sanitary pads.  Do not cross your legs for long periods of time.  Avoid cat litter boxes and soil used by cats. These carry germs that can cause birth defects in the baby and possibly loss of the fetus by miscarriage or stillbirth.  Avoid all smoking, herbs, alcohol, and medicines not prescribed by your health care provider. Chemicals in these affect the formation and growth of the baby.  Schedule a dentist appointment. At home, brush your teeth with a soft toothbrush and be gentle when you floss. SEEK MEDICAL CARE IF:   You have  dizziness.  You have mild pelvic cramps, pelvic pressure, or nagging pain in the abdominal area.  You have persistent nausea, vomiting, or diarrhea.  You have a bad smelling vaginal discharge.  You have pain with urination.  You notice increased swelling in your face, hands, legs, or ankles. SEEK IMMEDIATE MEDICAL CARE IF:   You have a fever.  You are leaking fluid from your vagina.  You have spotting or bleeding from your vagina.  You have severe abdominal cramping or pain.  You have rapid weight gain or loss.  You vomit blood or material that looks like coffee grounds.  You are exposed to German measles and have never had them.  You are exposed to fifth disease or chickenpox.  You develop a severe headache.  You have shortness of breath.  You have any kind of trauma, such as from a fall or a car accident. Document Released: 01/13/2001 Document Revised: 06/05/2013 Document Reviewed: 11/29/2012 ExitCare Patient Information 2015 [employer]. This information is not intended to replace advice given to you by your health care provider. Make sure you discuss any questions you have with your health care provider.   Nausea & Vomiting  Have saltine crackers or pretzels by your bed and eat a few bites before you raise your head out of bed in the morning  Eat small frequent meals throughout the day instead of large meals  Drink plenty of fluids throughout the day to stay hydrated, just don't drink a lot of fluids with your meals.  This can make your stomach fill up faster making you feel sick  Do not brush your teeth right after you eat  Products with real ginger are good for nausea, like ginger ale and ginger hard candy Make sure it says made with real ginger!  Sucking on sour candy like lemon heads is also good for nausea  If your prenatal vitamins make you nauseated, take them at night so you will sleep through the nausea  Sea Bands  If you feel like you need  medicine for the nausea & vomiting please let us know  If you are unable to keep any fluids or food down please let us know   Constipation  Drink plenty of fluid, preferably water, throughout the day  Eat foods high in fiber such as fruits, vegetables, and grains  Exercise, such as walking, is a good way to keep your bowels regular  Drink warm fluids, especially warm   prune juice, or decaf coffee  Eat a 1/2 cup of real oatmeal (not instant), 1/2 cup applesauce, and 1/2-1 cup warm prune juice every day  If needed, you may take Colace (docusate sodium) stool softener once or twice a day to help keep the stool soft. If you are pregnant, wait until you are out of your first trimester (12-14 weeks of pregnancy)  If you still are having problems with constipation, you may take Miralax once daily as needed to help keep your bowels regular.  If you are pregnant, wait until you are out of your first trimester (12-14 weeks of pregnancy)  Safe Medications in Pregnancy   Acne: Benzoyl Peroxide Salicylic Acid  Backache/Headache: Tylenol: 2 regular strength every 4 hours OR              2 Extra strength every 6 hours  Colds/Coughs/Allergies: Benadryl (alcohol free) 25 mg every 6 hours as needed Breath right strips Claritin Cepacol throat lozenges Chloraseptic throat spray Cold-Eeze- up to three times per day Cough drops, alcohol free Flonase (by prescription only) Guaifenesin Mucinex Robitussin DM (plain only, alcohol free) Saline nasal spray/drops Sudafed (pseudoephedrine) & Actifed ** use only after [redacted] weeks gestation and if you do not have high blood pressure Tylenol Vicks Vaporub Zinc lozenges Zyrtec   Constipation: Colace Ducolax suppositories Fleet enema Glycerin suppositories Metamucil Milk of magnesia Miralax Senokot Smooth move tea  Diarrhea: Kaopectate Imodium A-D  *NO pepto Bismol  Hemorrhoids: Anusol Anusol HC Preparation  H Tucks  Indigestion: Tums Maalox Mylanta Zantac  Pepcid  Insomnia: Benadryl (alcohol free) 25mg every 6 hours as needed Tylenol PM Unisom, no Gelcaps  Leg Cramps: Tums MagGel  Nausea/Vomiting:  Bonine Dramamine Emetrol Ginger extract Sea bands Meclizine  Nausea medication to take during pregnancy:  Unisom (doxylamine succinate 25 mg tablets) Take one tablet daily at bedtime. If symptoms are not adequately controlled, the dose can be increased to a maximum recommended dose of two tablets daily (1/2 tablet in the morning, 1/2 tablet mid-afternoon and one at bedtime). Vitamin B6 100mg tablets. Take one tablet twice a day (up to 200 mg per day).  Skin Rashes: Aveeno products Benadryl cream or 25mg every 6 hours as needed Calamine Lotion 1% cortisone cream  Yeast infection: Gyne-lotrimin 7 Monistat 7   **If taking multiple medications, please check labels to avoid duplicating the same active ingredients **take medication as directed on the label ** Do not exceed 4000 mg of tylenol in 24 hours **Do not take medications that contain aspirin or ibuprofen      

## 2017-06-09 LAB — URINALYSIS, ROUTINE W REFLEX MICROSCOPIC
Bilirubin, UA: NEGATIVE
GLUCOSE, UA: NEGATIVE
Leukocytes, UA: NEGATIVE
NITRITE UA: NEGATIVE
Protein, UA: NEGATIVE
RBC, UA: NEGATIVE
SPEC GRAV UA: 1.022 (ref 1.005–1.030)
Urobilinogen, Ur: 0.2 mg/dL (ref 0.2–1.0)
pH, UA: 5.5 (ref 5.0–7.5)

## 2017-06-09 LAB — OBSTETRIC PANEL, INCLUDING HIV
ANTIBODY SCREEN: NEGATIVE
Basophils Absolute: 0 10*3/uL (ref 0.0–0.2)
Basos: 0 %
EOS (ABSOLUTE): 0 10*3/uL (ref 0.0–0.4)
Eos: 0 %
HEMOGLOBIN: 14.1 g/dL (ref 11.1–15.9)
HEP B S AG: NEGATIVE
HIV Screen 4th Generation wRfx: NONREACTIVE
Hematocrit: 41.8 % (ref 34.0–46.6)
IMMATURE GRANS (ABS): 0 10*3/uL (ref 0.0–0.1)
Immature Granulocytes: 0 %
LYMPHS: 17 %
Lymphocytes Absolute: 1.8 10*3/uL (ref 0.7–3.1)
MCH: 32 pg (ref 26.6–33.0)
MCHC: 33.7 g/dL (ref 31.5–35.7)
MCV: 95 fL (ref 79–97)
MONOS ABS: 0.7 10*3/uL (ref 0.1–0.9)
Monocytes: 7 %
NEUTROS PCT: 76 %
Neutrophils Absolute: 7.8 10*3/uL — ABNORMAL HIGH (ref 1.4–7.0)
Platelets: 302 10*3/uL (ref 150–379)
RBC: 4.4 x10E6/uL (ref 3.77–5.28)
RDW: 13.1 % (ref 12.3–15.4)
RPR: NONREACTIVE
Rh Factor: POSITIVE
Rubella Antibodies, IGG: 3.97 index (ref >0.99)
WBC: 10.4 10*3/uL (ref 3.4–10.8)

## 2017-06-09 LAB — PMP SCREEN PROFILE (10S), URINE
AMPHETAMINE SCREEN URINE: NEGATIVE ng/mL
BARBITURATE SCREEN URINE: NEGATIVE ng/mL
BENZODIAZEPINE SCREEN, URINE: NEGATIVE ng/mL
CANNABINOIDS UR QL SCN: NEGATIVE ng/mL
COCAINE(METAB.)SCREEN, URINE: NEGATIVE ng/mL
Creatinine(Crt), U: 190.3 mg/dL (ref 20.0–300.0)
Methadone Screen, Urine: NEGATIVE ng/mL
OPIATE SCREEN URINE: NEGATIVE ng/mL
OXYCODONE+OXYMORPHONE UR QL SCN: NEGATIVE ng/mL
PHENCYCLIDINE QUANTITATIVE URINE: NEGATIVE ng/mL
Ph of Urine: 5.8 (ref 4.5–8.9)
Propoxyphene Scrn, Ur: NEGATIVE ng/mL

## 2017-06-09 LAB — GC/CHLAMYDIA PROBE AMP
Chlamydia trachomatis, NAA: NEGATIVE
NEISSERIA GONORRHOEAE BY PCR: NEGATIVE

## 2017-06-10 LAB — URINE CULTURE

## 2017-07-06 ENCOUNTER — Ambulatory Visit (INDEPENDENT_AMBULATORY_CARE_PROVIDER_SITE_OTHER): Payer: BLUE CROSS/BLUE SHIELD

## 2017-07-06 ENCOUNTER — Ambulatory Visit (INDEPENDENT_AMBULATORY_CARE_PROVIDER_SITE_OTHER): Payer: BLUE CROSS/BLUE SHIELD | Admitting: Obstetrics & Gynecology

## 2017-07-06 ENCOUNTER — Encounter: Payer: Self-pay | Admitting: Obstetrics & Gynecology

## 2017-07-06 VITALS — BP 111/75 | HR 80 | Wt 300.0 lb

## 2017-07-06 DIAGNOSIS — Z3A12 12 weeks gestation of pregnancy: Secondary | ICD-10-CM

## 2017-07-06 DIAGNOSIS — Z1389 Encounter for screening for other disorder: Secondary | ICD-10-CM

## 2017-07-06 DIAGNOSIS — Z3402 Encounter for supervision of normal first pregnancy, second trimester: Secondary | ICD-10-CM

## 2017-07-06 DIAGNOSIS — Z3682 Encounter for antenatal screening for nuchal translucency: Secondary | ICD-10-CM

## 2017-07-06 DIAGNOSIS — Z331 Pregnant state, incidental: Secondary | ICD-10-CM

## 2017-07-06 DIAGNOSIS — Z3481 Encounter for supervision of other normal pregnancy, first trimester: Secondary | ICD-10-CM

## 2017-07-06 LAB — POCT URINALYSIS DIPSTICK
GLUCOSE UA: NEGATIVE
Ketones, UA: NEGATIVE
NITRITE UA: NEGATIVE
Protein, UA: NEGATIVE
RBC UA: NEGATIVE

## 2017-07-06 NOTE — Progress Notes (Signed)
[id number] 6960w4d Estimated Date of Delivery: 01/14/18  Blood pressure 111/75, pulse 80, weight 300 lb (136.1 kg), last menstrual period 03/05/2017.   BP weight and urine results all reviewed and noted.  Please refer to the obstetrical flow sheet for the fundal height and fetal heart rate documentation:  Patient reports good fetal movement, denies any bleeding and no rupture of membranes symptoms or regular contractions. Patient is without complaints. All questions were answered.  Orders Placed This Encounter  Procedures  . Integrated 1  . POCT urinalysis dipstick    Plan:  Continued routine obstetrical care, NT scan is normal  Return in about 1 month (around 08/03/2017) for 2nd IT, , LROB.

## 2017-07-06 NOTE — Progress Notes (Signed)
US 12+4 wks,measurements c/w dates,normal ovaries bilat,crl 65.01 mm,NB present,NT 1.8 mm,fhr 152 bpm,posterior pl gr 0

## 2017-07-08 LAB — INTEGRATED 1
CROWN RUMP LENGTH MAT SCREEN: 65 mm
Gest. Age on Collection Date: 12.7 weeks
Maternal Age at EDD: 23.7 yr
Nuchal Translucency (NT): 1.8 mm
Number of Fetuses: 1
PAPP-A Value: 166.9 ng/mL
Weight: 300 [lb_av]

## 2017-07-15 ENCOUNTER — Telehealth: Payer: Self-pay | Admitting: Obstetrics & Gynecology

## 2017-07-15 ENCOUNTER — Encounter: Payer: Self-pay | Admitting: *Deleted

## 2017-07-15 NOTE — Telephone Encounter (Signed)
Husband called for his wife and stated that the job description that was sent and Dr Despina Hiddeneure wrote some orders on that form, her employer stated that they could not accept that and needed it written up in a letter or note.

## 2017-08-06 ENCOUNTER — Encounter: Payer: Self-pay | Admitting: Obstetrics & Gynecology

## 2017-08-06 ENCOUNTER — Ambulatory Visit (INDEPENDENT_AMBULATORY_CARE_PROVIDER_SITE_OTHER): Payer: BLUE CROSS/BLUE SHIELD | Admitting: Obstetrics & Gynecology

## 2017-08-06 VITALS — BP 119/75 | HR 86 | Wt 300.0 lb

## 2017-08-06 DIAGNOSIS — Z331 Pregnant state, incidental: Secondary | ICD-10-CM

## 2017-08-06 DIAGNOSIS — Z3A17 17 weeks gestation of pregnancy: Secondary | ICD-10-CM

## 2017-08-06 DIAGNOSIS — Z3482 Encounter for supervision of other normal pregnancy, second trimester: Secondary | ICD-10-CM

## 2017-08-06 DIAGNOSIS — Z1389 Encounter for screening for other disorder: Secondary | ICD-10-CM

## 2017-08-06 DIAGNOSIS — Z1379 Encounter for other screening for genetic and chromosomal anomalies: Secondary | ICD-10-CM

## 2017-08-06 LAB — POCT URINALYSIS DIPSTICK
GLUCOSE UA: NEGATIVE
LEUKOCYTES UA: NEGATIVE
NITRITE UA: NEGATIVE
Protein, UA: NEGATIVE
RBC UA: NEGATIVE

## 2017-08-06 NOTE — Progress Notes (Signed)
[id number] 6978w0d Estimated Date of Delivery: 01/14/18  Blood pressure 119/75, pulse 86, weight 300 lb (136.1 kg), last menstrual period 03/05/2017.   BP weight and urine results all reviewed and noted.  Please refer to the obstetrical flow sheet for the fundal height and fetal heart rate documentation:  Patient reports good fetal movement, denies any bleeding and no rupture of membranes symptoms or regular contractions. Patient is without complaints. All questions were answered.  Orders Placed This Encounter  Procedures  . US OB Comp + 14 Wk  . INTEGRATED 2  . POCT urinalysis dipstick    Plan:  Continued routine obstetrical care, 2nd IT Anatomy scan next visit  Return in about 3 weeks (around 08/27/2017) for 20 week sono, LROB.

## 2017-08-09 LAB — INTEGRATED 2
AFP MoM: 1.28
Alpha-Fetoprotein: 23.9 ng/mL
CROWN RUMP LENGTH: 65 mm
DIA MOM: 0.8
DIA VALUE: 94.3 pg/mL
Estriol, Unconjugated: 0.65 ng/mL
Gest. Age on Collection Date: 12.7 weeks
Gestational Age: 17.1 weeks
Maternal Age at EDD: 23.7 yr
NUCHAL TRANSLUCENCY (NT): 1.8 mm
NUCHAL TRANSLUCENCY MOM: 1.23
Number of Fetuses: 1
PAPP-A MOM: 0.41
PAPP-A VALUE: 166.9 ng/mL
Test Results:: NEGATIVE
Weight: 300 [lb_av]
Weight: 300 [lb_av]
hCG MoM: 0.92
hCG Value: 15.1 IU/mL
uE3 MoM: 0.79

## 2017-08-24 ENCOUNTER — Other Ambulatory Visit: Payer: Self-pay | Admitting: Obstetrics & Gynecology

## 2017-08-24 DIAGNOSIS — Z363 Encounter for antenatal screening for malformations: Secondary | ICD-10-CM

## 2017-08-24 DIAGNOSIS — Z3482 Encounter for supervision of other normal pregnancy, second trimester: Secondary | ICD-10-CM

## 2017-08-30 ENCOUNTER — Encounter: Payer: Self-pay | Admitting: Women's Health

## 2017-08-30 ENCOUNTER — Other Ambulatory Visit: Payer: Self-pay

## 2017-08-30 ENCOUNTER — Ambulatory Visit (INDEPENDENT_AMBULATORY_CARE_PROVIDER_SITE_OTHER): Payer: BLUE CROSS/BLUE SHIELD | Admitting: Women's Health

## 2017-08-30 ENCOUNTER — Ambulatory Visit (INDEPENDENT_AMBULATORY_CARE_PROVIDER_SITE_OTHER): Payer: BLUE CROSS/BLUE SHIELD

## 2017-08-30 VITALS — BP 107/70 | HR 74 | Wt 299.0 lb

## 2017-08-30 DIAGNOSIS — Z3482 Encounter for supervision of other normal pregnancy, second trimester: Secondary | ICD-10-CM

## 2017-08-30 DIAGNOSIS — Z3A20 20 weeks gestation of pregnancy: Secondary | ICD-10-CM

## 2017-08-30 DIAGNOSIS — Z331 Pregnant state, incidental: Secondary | ICD-10-CM

## 2017-08-30 DIAGNOSIS — Z363 Encounter for antenatal screening for malformations: Secondary | ICD-10-CM

## 2017-08-30 DIAGNOSIS — Z1389 Encounter for screening for other disorder: Secondary | ICD-10-CM

## 2017-08-30 LAB — POCT URINALYSIS DIPSTICK OB
GLUCOSE, UA: NEGATIVE — AB
Ketones, UA: NEGATIVE
LEUKOCYTES UA: NEGATIVE
Nitrite, UA: NEGATIVE
POC,PROTEIN,UA: NEGATIVE
RBC UA: NEGATIVE

## 2017-08-30 NOTE — Progress Notes (Signed)
   LOW-RISK PREGNANCY VISIT Patient name: Tammie Black MRN [id number]  Date of birth: 1995/03/03 Chief Complaint:   Routine Prenatal Visit (u/s today)  History of Present Illness:   Tammie Black is a 22 y.o. 813P1011 female at 5981w3d with an Estimated Date of Delivery: 01/14/18 being seen today for ongoing management of a low-risk pregnancy.  Today she reports no complaints.  . Vag. Bleeding: None.  Movement: Absent. denies leaking of fluid. Review of Systems:   Pertinent items are noted in HPI Denies abnormal vaginal discharge w/ itching/odor/irritation, headaches, visual changes, shortness of breath, chest pain, abdominal pain, severe nausea/vomiting, or problems with urination or bowel movements unless otherwise stated above. Pertinent History Reviewed:  Reviewed past medical,surgical, social, obstetrical and family history.  Reviewed problem list, medications and allergies. Physical Assessment:   Vitals:   08/30/17 1202  BP: 107/70  Pulse: 74  Weight: 299 lb (135.6 kg)  Body mass index is 48.26 kg/m.        Physical Examination:   General appearance: Well appearing, and in no distress  Mental status: Alert, oriented to person, place, and time  Skin: Warm & dry  Cardiovascular: Normal heart rate noted  Respiratory: Normal respiratory effort, no distress  Abdomen: Soft, gravid, nontender  Pelvic: Cervical exam deferred         Extremities: Edema: None  Fetal Status: Fetal Heart Rate (bpm): 152 u/s   Movement: Absent    US 20+3 wks,cephalic,posterior pl gr 0,normal ovaries bilat,svp of fluid 4.8 cm,fhr 152 bpm,cx 4.8 cm,efw 361 g,anatomy complete,no obvious abnormalities   Results for orders placed or performed in visit on 08/30/17 (from the past 24 hour(s))  POC Urinalysis Dipstick OB   Collection Time: 08/30/17 12:03 PM  Result Value Ref Range   Color, UA     Clarity, UA     Glucose, UA Negative (A) (none)   Bilirubin, UA     Ketones, UA neg    Spec Grav, UA  1.010 -  1.025   Blood, UA neg    pH, UA  5.0 - 8.0   POC Protein UA Negative Negative, Trace   Urobilinogen, UA  0.2 or 1.0 E.U./dL   Nitrite, UA neg    Leukocytes, UA Negative Negative   Appearance     Odor      Assessment & Plan:  1) Low-risk pregnancy G3P1011 at 4681w3d with an Estimated Date of Delivery: 01/14/18    Meds: No orders of the defined types were placed in this encounter.  Labs/procedures today: anatomy u/s  Plan:  Continue routine obstetrical care   Reviewed: Preterm labor symptoms and general obstetric precautions including but not limited to vaginal bleeding, contractions, leaking of fluid and fetal movement were reviewed in detail with the patient.  All questions were answered  Follow-up: Return in about 1 month (around 09/27/2017) for LROB.  Orders Placed This Encounter  Procedures  . POC Urinalysis Dipstick OB   Cheral MarkerKimberly R Booker CNM, [facility] HospitalWHNP-BC 08/30/2017 12:17 PM

## 2017-08-30 NOTE — Progress Notes (Signed)
US 20+3 wks,cephalic,posterior pl gr 0,normal ovaries bilat,svp of fluid 4.8 cm,fhr 152 bpm,cx 4.8 cm,efw 361 g,anatomy complete,no obvious abnormalities

## 2017-08-30 NOTE — Patient Instructions (Signed)
Tammie Black, I greatly value your feedback.  If you receive a survey following your visit with us today, we appreciate you taking the time to fill it out.  Thanks, Joellyn HaffKim Booker, CNM, WHNP-BC   Second Trimester of Pregnancy The second trimester is from week 14 through week 27 (months 4 through 6). The second trimester is often a time when you feel your best. Your body has adjusted to being pregnant, and you begin to feel better physically. Usually, morning sickness has lessened or quit completely, you may have more energy, and you may have an increase in appetite. The second trimester is also a time when the fetus is growing rapidly. At the end of the sixth month, the fetus is about 9 inches long and weighs about 1 pounds. You will likely begin to feel the baby move (quickening) between 16 and 20 weeks of pregnancy. Body changes during your second trimester Your body continues to go through many changes during your second trimester. The changes vary from woman to woman.  Your weight will continue to increase. You will notice your lower abdomen bulging out.  You may begin to get stretch marks on your hips, abdomen, and breasts.  You may develop headaches that can be relieved by medicines. The medicines should be approved by your health care provider.  You may urinate more often because the fetus is pressing on your bladder.  You may develop or continue to have heartburn as a result of your pregnancy.  You may develop constipation because certain hormones are causing the muscles that push waste through your intestines to slow down.  You may develop hemorrhoids or swollen, bulging veins (varicose veins).  You may have back pain. This is caused by: ? Weight gain. ? Pregnancy hormones that are relaxing the joints in your pelvis. ? A shift in weight and the muscles that support your balance.  Your breasts will continue to grow and they will continue to become tender.  Your gums may bleed and  may be sensitive to brushing and flossing.  Dark spots or blotches (chloasma, mask of pregnancy) may develop on your face. This will likely fade after the baby is born.  A dark line from your belly button to the pubic area (linea nigra) may appear. This will likely fade after the baby is born.  You may have changes in your hair. These can include thickening of your hair, rapid growth, and changes in texture. Some women also have hair loss during or after pregnancy, or hair that feels dry or thin. Your hair will most likely return to normal after your baby is born.  What to expect at prenatal visits During a routine prenatal visit:  You will be weighed to make sure you and the fetus are growing normally.  Your blood pressure will be taken.  Your abdomen will be measured to track your baby's growth.  The fetal heartbeat will be listened to.  Any test results from the previous visit will be discussed.  Your health care provider may ask you:  How you are feeling.  If you are feeling the baby move.  If you have had any abnormal symptoms, such as leaking fluid, bleeding, severe headaches, or abdominal cramping.  If you are using any tobacco products, including cigarettes, chewing tobacco, and electronic cigarettes.  If you have any questions.  Other tests that may be performed during your second trimester include:  Blood tests that check for: ? Low iron levels (anemia). ? High blood  sugar that affects pregnant women (gestational diabetes) between 55 and 28 weeks. ? Rh antibodies. This is to check for a protein on red blood cells (Rh factor).  Urine tests to check for infections, diabetes, or protein in the urine.  An ultrasound to confirm the proper growth and development of the baby.  An amniocentesis to check for possible genetic problems.  Fetal screens for spina bifida and Down syndrome.  HIV (human immunodeficiency virus) testing. Routine prenatal testing includes  screening for HIV, unless you choose not to have this test.  Follow these instructions at home: Medicines  Follow your health care provider's instructions regarding medicine use. Specific medicines may be either safe or unsafe to take during pregnancy.  Take a prenatal vitamin that contains at least 600 micrograms (mcg) of folic acid.  If you develop constipation, try taking a stool softener if your health care provider approves. Eating and drinking  Eat a balanced diet that includes fresh fruits and vegetables, whole grains, good sources of protein such as meat, eggs, or tofu, and low-fat dairy. Your health care provider will help you determine the amount of weight gain that is right for you.  Avoid raw meat and uncooked cheese. These carry germs that can cause birth defects in the baby.  If you have low calcium intake from food, talk to your health care provider about whether you should take a daily calcium supplement.  Limit foods that are high in fat and processed sugars, such as fried and sweet foods.  To prevent constipation: ? Drink enough fluid to keep your urine clear or pale yellow. ? Eat foods that are high in fiber, such as fresh fruits and vegetables, whole grains, and beans. Activity  Exercise only as directed by your health care provider. Most women can continue their usual exercise routine during pregnancy. Try to exercise for 30 minutes at least 5 days a week. Stop exercising if you experience uterine contractions.  Avoid heavy lifting, wear low heel shoes, and practice good posture.  A sexual relationship may be continued unless your health care provider directs you otherwise. Relieving pain and discomfort  Wear a good support bra to prevent discomfort from breast tenderness.  Take warm sitz baths to soothe any pain or discomfort caused by hemorrhoids. Use hemorrhoid cream if your health care provider approves.  Rest with your legs elevated if you have leg cramps  or low back pain.  If you develop varicose veins, wear support hose. Elevate your feet for 15 minutes, 3-4 times a day. Limit salt in your diet. Prenatal Care  Write down your questions. Take them to your prenatal visits.  Keep all your prenatal visits as told by your health care provider. This is important. Safety  Wear your seat belt at all times when driving.  Make a list of emergency phone numbers, including numbers for family, friends, the hospital, and police and fire departments. General instructions  Ask your health care provider for a referral to a local prenatal education class. Begin classes no later than the beginning of month 6 of your pregnancy.  Ask for help if you have counseling or nutritional needs during pregnancy. Your health care provider can offer advice or refer you to specialists for help with various needs.  Do not use hot tubs, steam rooms, or saunas.  Do not douche or use tampons or scented sanitary pads.  Do not cross your legs for long periods of time.  Avoid cat litter boxes and soil used  by cats. These carry germs that can cause birth defects in the baby and possibly loss of the fetus by miscarriage or stillbirth.  Avoid all smoking, herbs, alcohol, and unprescribed drugs. Chemicals in these products can affect the formation and growth of the baby.  Do not use any products that contain nicotine or tobacco, such as cigarettes and e-cigarettes. If you need help quitting, ask your health care provider.  Visit your dentist if you have not gone yet during your pregnancy. Use a soft toothbrush to brush your teeth and be gentle when you floss. Contact a health care provider if:  You have dizziness.  You have mild pelvic cramps, pelvic pressure, or nagging pain in the abdominal area.  You have persistent nausea, vomiting, or diarrhea.  You have a bad smelling vaginal discharge.  You have pain when you urinate. Get help right away if:  You have a  fever.  You are leaking fluid from your vagina.  You have spotting or bleeding from your vagina.  You have severe abdominal cramping or pain.  You have rapid weight gain or weight loss.  You have shortness of breath with chest pain.  You notice sudden or extreme swelling of your face, hands, ankles, feet, or legs.  You have not felt your baby move in over an hour.  You have severe headaches that do not go away when you take medicine.  You have vision changes. Summary  The second trimester is from week 14 through week 27 (months 4 through 6). It is also a time when the fetus is growing rapidly.  Your body goes through many changes during pregnancy. The changes vary from woman to woman.  Avoid all smoking, herbs, alcohol, and unprescribed drugs. These chemicals affect the formation and growth your baby.  Do not use any tobacco products, such as cigarettes, chewing tobacco, and e-cigarettes. If you need help quitting, ask your health care provider.  Contact your health care provider if you have any questions. Keep all prenatal visits as told by your health care provider. This is important. This information is not intended to replace advice given to you by your health care provider. Make sure you discuss any questions you have with your health care provider. Document Released: 01/13/2001 Document Revised: 06/27/2015 Document Reviewed: 03/22/2012 Elsevier Interactive Patient Education  2017 [employer].

## 2017-09-27 ENCOUNTER — Ambulatory Visit (INDEPENDENT_AMBULATORY_CARE_PROVIDER_SITE_OTHER): Payer: BLUE CROSS/BLUE SHIELD | Admitting: Women's Health

## 2017-09-27 ENCOUNTER — Encounter: Payer: Self-pay | Admitting: Women's Health

## 2017-09-27 VITALS — BP 127/88 | HR 99 | Wt 301.2 lb

## 2017-09-27 DIAGNOSIS — Z3482 Encounter for supervision of other normal pregnancy, second trimester: Secondary | ICD-10-CM

## 2017-09-27 DIAGNOSIS — Z3A24 24 weeks gestation of pregnancy: Secondary | ICD-10-CM

## 2017-09-27 DIAGNOSIS — Z331 Pregnant state, incidental: Secondary | ICD-10-CM

## 2017-09-27 DIAGNOSIS — Z1389 Encounter for screening for other disorder: Secondary | ICD-10-CM

## 2017-09-27 LAB — POCT URINALYSIS DIPSTICK OB
Blood, UA: NEGATIVE
Glucose, UA: NEGATIVE — AB
Ketones, UA: NEGATIVE
LEUKOCYTES UA: NEGATIVE
NITRITE UA: NEGATIVE
POC,PROTEIN,UA: NEGATIVE

## 2017-09-27 NOTE — Progress Notes (Signed)
   LOW-RISK PREGNANCY VISIT Patient name: Tammie Black MRN [id number]  Date of birth: Jun 23, 1995 Chief Complaint:   Routine Prenatal Visit  History of Present Illness:   Tammie Black is a 22 y.o. 323P1011 female at 3268w3d with an Estimated Date of Delivery: 01/14/18 being seen today for ongoing management of a low-risk pregnancy.  Today she reports no complaints. BP elevated x 2 today, Denies ha, visual changes, ruq/epigastric pain, n/v.  No h/o HTN. Same FOB.  . Vag. Bleeding: None.  Movement: Present. denies leaking of fluid. Review of Systems:   Pertinent items are noted in HPI Denies abnormal vaginal discharge w/ itching/odor/irritation, headaches, visual changes, shortness of breath, chest pain, abdominal pain, severe nausea/vomiting, or problems with urination or bowel movements unless otherwise stated above. Pertinent History Reviewed:  Reviewed past medical,surgical, social, obstetrical and family history.  Reviewed problem list, medications and allergies. Physical Assessment:   Vitals:   09/27/17 0835 09/27/17 0840 09/27/17 0908  BP: (!) 134/92 (!) 136/94 127/88  Pulse: 99    Weight: (!) 301 lb 3.2 oz (136.6 kg)    Body mass index is 48.61 kg/m.        Physical Examination:   General appearance: Well appearing, and in no distress  Mental status: Alert, oriented to person, place, and time  Skin: Warm & dry  Cardiovascular: Normal heart rate noted  Respiratory: Normal respiratory effort, no distress  Abdomen: Soft, gravid, nontender  Pelvic: Cervical exam deferred         Extremities: Edema: None  Fetal Status: Fetal Heart Rate (bpm): 145 Fundal Height: 25 cm Movement: Present    Results for orders placed or performed in visit on 09/27/17 (from the past 24 hour(s))  POC Urinalysis Dipstick OB   Collection Time: 09/27/17  9:12 AM  Result Value Ref Range   Color, UA     Clarity, UA     Glucose, UA Negative (A) (none)   Bilirubin, UA     Ketones, UA neg    Spec Grav,  UA     Blood, UA neg    pH, UA     POC Protein UA Negative Negative, Trace   Urobilinogen, UA     Nitrite, UA neg    Leukocytes, UA Negative Negative   Appearance     Odor      Assessment & Plan:  1) Low-risk pregnancy G3P1011 at 7368w3d with an Estimated Date of Delivery: 01/14/18   2) Transient elevated bp, normal on 2nd repeat, no proteinuria, asymptomatic, will bring back for bp check w/ nurse in 2d. Reviewed pre-e s/s, reasons to seek care   Meds: No orders of the defined types were placed in this encounter.  Labs/procedures today: none  Plan:  Continue routine obstetrical care   Reviewed: Preterm labor symptoms and general obstetric precautions including but not limited to vaginal bleeding, contractions, leaking of fluid and fetal movement were reviewed in detail with the patient.  All questions were answered  Follow-up: Return in about 2 days (around 09/29/2017) for for bp check w/ nurse, then 4wks for LROB , PN2.  Orders Placed This Encounter  Procedures  . POC Urinalysis Dipstick OB   Cheral MarkerKimberly R Aveline Daus CNM, [facility]WHNP-BC 09/27/2017 10:55 AM

## 2017-09-27 NOTE — Patient Instructions (Addendum)
Tammie Black, I greatly value your feedback.  If you receive a survey following your visit with [place] today, we appreciate you taking the time to fill it out.  Thanks, [name], CNM, WHNP-BC   You will have your sugar test next visit.  Please do not eat or drink anything after midnight the night before you come, not even water.  You will be here for at least two hours.     Call the office [phone]) or go to [facility] if:  You begin to have strong, frequent contractions  Your water breaks.  Sometimes it is a big gush of fluid, sometimes it is just a trickle that keeps getting your panties wet or running down your legs  You have vaginal bleeding.  It is normal to have a small amount of spotting if your cervix was checked.   You don't feel your baby moving like normal.  If you don't, get you something to eat and drink and lay down and focus on feeling your baby move.   If your baby is still not moving like normal, you should call the office or go to [facility].  Call the office [phone]) or go to [facility] hospital for these signs of pre-eclampsia:  Severe headache that does not go away with Tylenol  Visual changes- seeing spots, double, blurred vision  Pain under your right breast or upper abdomen that does not go away with Tums or heartburn medicine  Nausea and/or vomiting  Severe swelling in your hands, feet, and face      Second Trimester of Pregnancy The second trimester is from week 13 through week 28, months 4 through 6. The second trimester is often a time when you feel your best. Your body has also adjusted to being pregnant, and you begin to feel better physically. Usually, morning sickness has lessened or quit completely, you may have more energy, and you may have an increase in appetite. The second trimester is also a time when the fetus is growing rapidly. At the end of the sixth month, the fetus is about 9 inches long and weighs about 1 pounds. You will likely  begin to feel the baby move (quickening) between 18 and 20 weeks of the pregnancy. BODY CHANGES Your body goes through many changes during pregnancy. The changes vary from woman to woman.   Your weight will continue to increase. You will notice your lower abdomen bulging out.  You may begin to get stretch marks on your hips, abdomen, and breasts.  You may develop headaches that can be relieved by medicines approved by your health care provider.  You may urinate more often because the fetus is pressing on your bladder.  You may develop or continue to have heartburn as a result of your pregnancy.  You may develop constipation because certain hormones are causing the muscles that push waste through your intestines to slow down.  You may develop hemorrhoids or swollen, bulging veins (varicose veins).  You may have back pain because of the weight gain and pregnancy hormones relaxing your joints between the bones in your pelvis and as a result of a shift in weight and the muscles that support your balance.  Your breasts will continue to grow and be tender.  Your gums may bleed and may be sensitive to brushing and flossing.  Dark spots or blotches (chloasma, mask of pregnancy) may develop on your face. This will likely fade after the baby is born.  A dark line from  your belly button to the pubic area (linea nigra) may appear. This will likely fade after the baby is born.  You may have changes in your hair. These can include thickening of your hair, rapid growth, and changes in texture. Some women also have hair loss during or after pregnancy, or hair that feels dry or thin. Your hair will most likely return to normal after your baby is born. WHAT TO EXPECT AT YOUR PRENATAL VISITS During a routine prenatal visit:  You will be weighed to make sure you and the fetus are growing normally.  Your blood pressure will be taken.  Your abdomen will be measured to track your baby's growth.  The  fetal heartbeat will be listened to.  Any test results from the previous visit will be discussed. Your health care provider may ask you:  How you are feeling.  If you are feeling the baby move.  If you have had any abnormal symptoms, such as leaking fluid, bleeding, severe headaches, or abdominal cramping.  If you have any questions. Other tests that may be performed during your second trimester include:  Blood tests that check for:  Low iron levels (anemia).  Gestational diabetes (between 24 and 28 weeks).  Rh antibodies.  Urine tests to check for infections, diabetes, or protein in the urine.  An ultrasound to confirm the proper growth and development of the baby.  An amniocentesis to check for possible genetic problems.  Fetal screens for spina bifida and Down syndrome. HOME CARE INSTRUCTIONS   Avoid all smoking, herbs, alcohol, and unprescribed drugs. These chemicals affect the formation and growth of the baby.  Follow your health care provider's instructions regarding medicine use. There are medicines that are either safe or unsafe to take during pregnancy.  Exercise only as directed by your health care provider. Experiencing uterine cramps is a good sign to stop exercising.  Continue to eat regular, healthy meals.  Wear a good support bra for breast tenderness.  Do not use hot tubs, steam rooms, or saunas.  Wear your seat belt at all times when driving.  Avoid raw meat, uncooked cheese, cat litter boxes, and soil used by cats. These carry germs that can cause birth defects in the baby.  Take your prenatal vitamins.  Try taking a stool softener (if your health care provider approves) if you develop constipation. Eat more high-fiber foods, such as fresh vegetables or fruit and whole grains. Drink plenty of fluids to keep your urine clear or pale yellow.  Take warm sitz baths to soothe any pain or discomfort caused by hemorrhoids. Use hemorrhoid cream if your  health care provider approves.  If you develop varicose veins, wear support hose. Elevate your feet for 15 minutes, 3-4 times a day. Limit salt in your diet.  Avoid heavy lifting, wear low heel shoes, and practice good posture.  Rest with your legs elevated if you have leg cramps or low back pain.  Visit your dentist if you have not gone yet during your pregnancy. Use a soft toothbrush to brush your teeth and be gentle when you floss.  A sexual relationship may be continued unless your health care provider directs you otherwise.  Continue to go to all your prenatal visits as directed by your health care provider. SEEK MEDICAL CARE IF:   You have dizziness.  You have mild pelvic cramps, pelvic pressure, or nagging pain in the abdominal area.  You have persistent nausea, vomiting, or diarrhea.  You have a bad  smelling vaginal discharge.  You have pain with urination. SEEK IMMEDIATE MEDICAL CARE IF:   You have a fever.  You are leaking fluid from your vagina.  You have spotting or bleeding from your vagina.  You have severe abdominal cramping or pain.  You have rapid weight gain or loss.  You have shortness of breath with chest pain.  You notice sudden or extreme swelling of your face, hands, ankles, feet, or legs.  You have not felt your baby move in over an hour.  You have severe headaches that do not go away with medicine.  You have vision changes. Document Released: 01/13/2001 Document Revised: 01/24/2013 Document Reviewed: 03/22/2012 [facility] Patient Information 2015 [place], [place]. This information is not intended to replace advice given to you by your health care provider. Make sure you discuss any questions you have with your health care provider.

## 2017-09-29 ENCOUNTER — Encounter: Payer: Self-pay | Admitting: *Deleted

## 2017-09-29 ENCOUNTER — Ambulatory Visit (INDEPENDENT_AMBULATORY_CARE_PROVIDER_SITE_OTHER): Payer: BLUE CROSS/BLUE SHIELD | Admitting: *Deleted

## 2017-09-29 VITALS — BP 130/82 | HR 75 | Ht 65.0 in | Wt 300.2 lb

## 2017-09-29 DIAGNOSIS — Z331 Pregnant state, incidental: Secondary | ICD-10-CM

## 2017-09-29 DIAGNOSIS — Z1389 Encounter for screening for other disorder: Secondary | ICD-10-CM

## 2017-09-29 DIAGNOSIS — Z0130 Encounter for examination of blood pressure without abnormal findings: Secondary | ICD-10-CM

## 2017-09-29 LAB — POCT URINALYSIS DIPSTICK OB
Blood, UA: NEGATIVE
Glucose, UA: NEGATIVE
LEUKOCYTES UA: NEGATIVE
NITRITE UA: NEGATIVE
PROTEIN: NEGATIVE

## 2017-09-29 NOTE — Progress Notes (Signed)
Pt came in for BP check. BP today was 130/82. I reviewed BP reading with Dr. Emelda FearFerguson and he advised BP is good. Return in 4 weeks for LROB. JSY

## 2017-10-25 ENCOUNTER — Ambulatory Visit (INDEPENDENT_AMBULATORY_CARE_PROVIDER_SITE_OTHER): Payer: BLUE CROSS/BLUE SHIELD | Admitting: Women's Health

## 2017-10-25 ENCOUNTER — Encounter: Payer: Self-pay | Admitting: Women's Health

## 2017-10-25 VITALS — BP 132/85 | HR 77 | Wt 300.0 lb

## 2017-10-25 DIAGNOSIS — Z3A28 28 weeks gestation of pregnancy: Secondary | ICD-10-CM

## 2017-10-25 DIAGNOSIS — Z23 Encounter for immunization: Secondary | ICD-10-CM

## 2017-10-25 DIAGNOSIS — Z3483 Encounter for supervision of other normal pregnancy, third trimester: Secondary | ICD-10-CM

## 2017-10-25 DIAGNOSIS — Z331 Pregnant state, incidental: Secondary | ICD-10-CM

## 2017-10-25 DIAGNOSIS — Z1389 Encounter for screening for other disorder: Secondary | ICD-10-CM

## 2017-10-25 LAB — POCT URINALYSIS DIPSTICK OB
Glucose, UA: NEGATIVE
LEUKOCYTES UA: NEGATIVE
Nitrite, UA: NEGATIVE
PROTEIN: NEGATIVE
RBC UA: NEGATIVE

## 2017-10-25 NOTE — Patient Instructions (Signed)
You will have your sugar test next visit.  Please do not eat or drink anything after midnight the night before you come, not even water.  You will be here for at least two hours.     Tammie Black, I greatly value your feedback.  If you receive a survey following your visit with [place] today, we appreciate you taking the time to fill it out.  Thanks, Tammie Black, CNM, WHNP-BC   Call the office [phone]) or go to [facility] if:  You begin to have strong, frequent contractions  Your water breaks.  Sometimes it is a big gush of fluid, sometimes it is just a trickle that keeps getting your panties wet or running down your legs  You have vaginal bleeding.  It is normal to have a small amount of spotting if your cervix was checked.   You don't feel your baby moving like normal.  If you don't, get you something to eat and drink and lay down and focus on feeling your baby move.  You should feel at least 10 movements in 2 hours.  If you don't, you should call the office or go to [facility].    Tdap Vaccine  It is recommended that you get the Tdap vaccine during the third trimester of EACH pregnancy to help protect your baby from getting pertussis (whooping cough)  27-36 weeks is the BEST time to do this so that you can pass the protection on to your baby. During pregnancy is better than after pregnancy, but if you are unable to get it during pregnancy it will be offered at the hospital.   You can get this vaccine with [place], at the health department, your family doctor, or some local pharmacies  Everyone who will be around your baby should also be up-to-date on their vaccines before the baby comes. Adults (who are not pregnant) only need 1 dose of Tdap during adulthood.   Third Trimester of Pregnancy The third trimester is from week 29 through week 42, months 7 through 9. The third trimester is a time when the fetus is growing rapidly. At the end of the ninth month, the fetus is about 20  inches in length and weighs 6-10 pounds.  BODY CHANGES Your body goes through many changes during pregnancy. The changes vary from woman to woman.   Your weight will continue to increase. You can expect to gain 25-35 pounds (11-16 kg) by the end of the pregnancy.  You may begin to get stretch marks on your hips, abdomen, and breasts.  You may urinate more often because the fetus is moving lower into your pelvis and pressing on your bladder.  You may develop or continue to have heartburn as a result of your pregnancy.  You may develop constipation because certain hormones are causing the muscles that push waste through your intestines to slow down.  You may develop hemorrhoids or swollen, bulging veins (varicose veins).  You may have pelvic pain because of the weight gain and pregnancy hormones relaxing your joints between the bones in your pelvis. Backaches may result from overexertion of the muscles supporting your posture.  You may have changes in your hair. These can include thickening of your hair, rapid growth, and changes in texture. Some women also have hair loss during or after pregnancy, or hair that feels dry or thin. Your hair will most likely return to normal after your baby is born.  Your breasts will continue to grow and be tender.  A yellow discharge may leak from your breasts called colostrum.  Your belly button may stick out.  You may feel short of breath because of your expanding uterus.  You may notice the fetus "dropping," or moving lower in your abdomen.  You may have a bloody mucus discharge. This usually occurs a few days to a week before labor begins.  Your cervix becomes thin and soft (effaced) near your due date. WHAT TO EXPECT AT YOUR PRENATAL EXAMS  You will have prenatal exams every 2 weeks until week 36. Then, you will have weekly prenatal exams. During a routine prenatal visit:  You will be weighed to make sure you and the fetus are growing  normally.  Your blood pressure is taken.  Your abdomen will be measured to track your baby's growth.  The fetal heartbeat will be listened to.  Any test results from the previous visit will be discussed.  You may have a cervical check near your due date to see if you have effaced. At around 36 weeks, your caregiver will check your cervix. At the same time, your caregiver will also perform a test on the secretions of the vaginal tissue. This test is to determine if a type of bacteria, Group B streptococcus, is present. Your caregiver will explain this further. Your caregiver may ask you:  What your birth plan is.  How you are feeling.  If you are feeling the baby move.  If you have had any abnormal symptoms, such as leaking fluid, bleeding, severe headaches, or abdominal cramping.  If you have any questions. Other tests or screenings that may be performed during your third trimester include:  Blood tests that check for low iron levels (anemia).  Fetal testing to check the health, activity level, and growth of the fetus. Testing is done if you have certain medical conditions or if there are problems during the pregnancy. FALSE LABOR You may feel small, irregular contractions that eventually go away. These are called Braxton Hicks contractions, or false labor. Contractions may last for hours, days, or even weeks before true labor sets in. If contractions come at regular intervals, intensify, or become painful, it is best to be seen by your caregiver.  SIGNS OF LABOR   Menstrual-like cramps.  Contractions that are 5 minutes apart or less.  Contractions that start on the top of the uterus and spread down to the lower abdomen and back.  A sense of increased pelvic pressure or back pain.  A watery or bloody mucus discharge that comes from the vagina. If you have any of these signs before the 37th week of pregnancy, call your caregiver right away. You need to go to the hospital to  get checked immediately. HOME CARE INSTRUCTIONS   Avoid all smoking, herbs, alcohol, and unprescribed drugs. These chemicals affect the formation and growth of the baby.  Follow your caregiver's instructions regarding medicine use. There are medicines that are either safe or unsafe to take during pregnancy.  Exercise only as directed by your caregiver. Experiencing uterine cramps is a good sign to stop exercising.  Continue to eat regular, healthy meals.  Wear a good support bra for breast tenderness.  Do not use hot tubs, steam rooms, or saunas.  Wear your seat belt at all times when driving.  Avoid raw meat, uncooked cheese, cat litter boxes, and soil used by cats. These carry germs that can cause birth defects in the baby.  Take your prenatal vitamins.  Try taking a stool  softener (if your caregiver approves) if you develop constipation. Eat more high-fiber foods, such as fresh vegetables or fruit and whole grains. Drink plenty of fluids to keep your urine clear or pale yellow.  Take warm sitz baths to soothe any pain or discomfort caused by hemorrhoids. Use hemorrhoid cream if your caregiver approves.  If you develop varicose veins, wear support hose. Elevate your feet for 15 minutes, 3-4 times a day. Limit salt in your diet.  Avoid heavy lifting, wear low heal shoes, and practice good posture.  Rest a lot with your legs elevated if you have leg cramps or low back pain.  Visit your dentist if you have not gone during your pregnancy. Use a soft toothbrush to brush your teeth and be gentle when you floss.  A sexual relationship may be continued unless your caregiver directs you otherwise.  Do not travel far distances unless it is absolutely necessary and only with the approval of your caregiver.  Take prenatal classes to understand, practice, and ask questions about the labor and delivery.  Make a trial run to the hospital.  Pack your hospital bag.  Prepare the baby's  nursery.  Continue to go to all your prenatal visits as directed by your caregiver. SEEK MEDICAL CARE IF:  You are unsure if you are in labor or if your water has broken.  You have dizziness.  You have mild pelvic cramps, pelvic pressure, or nagging pain in your abdominal area.  You have persistent nausea, vomiting, or diarrhea.  You have a bad smelling vaginal discharge.  You have pain with urination. SEEK IMMEDIATE MEDICAL CARE IF:   You have a fever.  You are leaking fluid from your vagina.  You have spotting or bleeding from your vagina.  You have severe abdominal cramping or pain.  You have rapid weight loss or gain.  You have shortness of breath with chest pain.  You notice sudden or extreme swelling of your face, hands, ankles, feet, or legs.  You have not felt your baby move in over an hour.  You have severe headaches that do not go away with medicine.  You have vision changes. Document Released: 01/13/2001 Document Revised: 01/24/2013 Document Reviewed: 03/22/2012 [facility] Patient Information 2015 [place], [place]. This information is not intended to replace advice given to you by your health care provider. Make sure you discuss any questions you have with your health care provider.

## 2017-10-25 NOTE — Progress Notes (Signed)
   LOW-RISK PREGNANCY VISIT Patient name: Tammie Black MRN [id number]  Date of birth: 1996-01-12 Chief Complaint:   Routine Prenatal Visit  History of Present Illness:   Tammie Black is a 22 y.o. 953P1011 female at 5871w3d with an Estimated Date of Delivery: 01/14/18 being seen today for ongoing management of a low-risk pregnancy.  Today she reports no complaints. Not doing pn2 today, states it was originally scheduled, then appt got moved and pn2 did not get added. Wants to come out of work @ 36wks- discussed would not be medically indicated- should talk to her work to make sure ok.  Contractions: Not present. Vag. Bleeding: None.  Movement: Present. denies leaking of fluid. Review of Systems:   Pertinent items are noted in HPI Denies abnormal vaginal discharge w/ itching/odor/irritation, headaches, visual changes, shortness of breath, chest pain, abdominal pain, severe nausea/vomiting, or problems with urination or bowel movements unless otherwise stated above. Pertinent History Reviewed:  Reviewed past medical,surgical, social, obstetrical and family history.  Reviewed problem list, medications and allergies. Physical Assessment:   Vitals:   10/25/17 1139  BP: 132/85  Pulse: 77  Weight: 300 lb (136.1 kg)  Body mass index is 49.92 kg/m.        Physical Examination:   General appearance: Well appearing, and in no distress  Mental status: Alert, oriented to person, place, and time  Skin: Warm & dry  Cardiovascular: Normal heart rate noted  Respiratory: Normal respiratory effort, no distress  Abdomen: Soft, gravid, nontender  Pelvic: Cervical exam deferred         Extremities: Edema: None  Fetal Status: Fetal Heart Rate (bpm): 137 Fundal Height: 30 cm Movement: Present    Results for orders placed or performed in visit on 10/25/17 (from the past 24 hour(s))  POC Urinalysis Dipstick OB   Collection Time: 10/25/17 11:39 AM  Result Value Ref Range   Color, UA     Clarity, UA     Glucose, UA Negative Negative   Bilirubin, UA     Ketones, UA 2+    Spec Grav, UA     Blood, UA neg    pH, UA     POC Protein UA Negative Negative, Trace   Urobilinogen, UA     Nitrite, UA neg    Leukocytes, UA Negative Negative   Appearance     Odor      Assessment & Plan:  1) Low-risk pregnancy G3P1011 at 871w3d with an Estimated Date of Delivery: 01/14/18    Meds: No orders of the defined types were placed in this encounter.  Labs/procedures today: flu & tdap shots  Plan:  Continue routine obstetrical care   Reviewed: Preterm labor symptoms and general obstetric precautions including but not limited to vaginal bleeding, contractions, leaking of fluid and fetal movement were reviewed in detail with the patient.  All questions were answered  Follow-up: Return for asap for pn2 (no visit), then 4wks for LROB.  Orders Placed This Encounter  Procedures  . Tdap vaccine greater than or equal to 7yo IM  . Flu Vaccine QUAD 36+ mos IM (Fluarix, Quad PF)  . POC Urinalysis Dipstick OB   Cheral MarkerKimberly R Aven Christen CNM, [facility]NP-BC 10/25/2017 12:08 PM

## 2017-10-27 ENCOUNTER — Other Ambulatory Visit: Payer: BLUE CROSS/BLUE SHIELD

## 2017-10-28 ENCOUNTER — Other Ambulatory Visit: Payer: BLUE CROSS/BLUE SHIELD

## 2017-10-29 LAB — CBC
Hematocrit: 40.9 % (ref 34.0–46.6)
Hemoglobin: 13.8 g/dL (ref 11.1–15.9)
MCH: 31.7 pg (ref 26.6–33.0)
MCHC: 33.7 g/dL (ref 31.5–35.7)
MCV: 94 fL (ref 79–97)
PLATELETS: 269 10*3/uL (ref 150–450)
RBC: 4.36 x10E6/uL (ref 3.77–5.28)
RDW: 12.7 % (ref 12.3–15.4)
WBC: 10.6 10*3/uL (ref 3.4–10.8)

## 2017-10-29 LAB — ANTIBODY SCREEN: Antibody Screen: NEGATIVE

## 2017-10-29 LAB — GLUCOSE TOLERANCE, 2 HOURS W/ 1HR
Glucose, 1 hour: 109 mg/dL (ref 65–179)
Glucose, 2 hour: 76 mg/dL (ref 65–152)
Glucose, Fasting: 78 mg/dL (ref 65–91)

## 2017-10-29 LAB — HIV ANTIBODY (ROUTINE TESTING W REFLEX): HIV SCREEN 4TH GENERATION: NONREACTIVE

## 2017-10-29 LAB — RPR: RPR: NONREACTIVE

## 2017-11-22 ENCOUNTER — Ambulatory Visit (INDEPENDENT_AMBULATORY_CARE_PROVIDER_SITE_OTHER): Payer: BLUE CROSS/BLUE SHIELD | Admitting: Women's Health

## 2017-11-22 ENCOUNTER — Encounter: Payer: Self-pay | Admitting: Women's Health

## 2017-11-22 VITALS — BP 124/78 | HR 95 | Wt 303.0 lb

## 2017-11-22 DIAGNOSIS — Z1389 Encounter for screening for other disorder: Secondary | ICD-10-CM

## 2017-11-22 DIAGNOSIS — Z331 Pregnant state, incidental: Secondary | ICD-10-CM

## 2017-11-22 DIAGNOSIS — Z3483 Encounter for supervision of other normal pregnancy, third trimester: Secondary | ICD-10-CM

## 2017-11-22 DIAGNOSIS — Z3A32 32 weeks gestation of pregnancy: Secondary | ICD-10-CM

## 2017-11-22 LAB — POCT URINALYSIS DIPSTICK OB
Blood, UA: NEGATIVE
Glucose, UA: NEGATIVE
Ketones, UA: NEGATIVE
Leukocytes, UA: NEGATIVE
Nitrite, UA: NEGATIVE
POC,PROTEIN,UA: NEGATIVE

## 2017-11-22 NOTE — Progress Notes (Signed)
   LOW-RISK PREGNANCY VISIT Patient name: Tammie Black MRN [id number]  Date of birth: 08/31/1995 Chief Complaint:   Routine Prenatal Visit  History of Present Illness:   Tammie Black is a 22 y.o. G38P1011 female at [redacted]w[redacted]d with an Estimated Date of Delivery: 01/14/18 being seen today for ongoing management of a low-risk pregnancy.  Today she reports no complaints. Contractions: Not present. Vag. Bleeding: None.  Movement: Present. denies leaking of fluid. Review of Systems:   Pertinent items are noted in HPI Denies abnormal vaginal discharge w/ itching/odor/irritation, headaches, visual changes, shortness of breath, chest pain, abdominal pain, severe nausea/vomiting, or problems with urination or bowel movements unless otherwise stated above. Pertinent History Reviewed:  Reviewed past medical,surgical, social, obstetrical and family history.  Reviewed problem list, medications and allergies. Physical Assessment:   Vitals:   11/22/17 0911  BP: 124/78  Pulse: 95  Weight: (!) 303 lb (137.4 kg)  Body mass index is 50.42 kg/m.        Physical Examination:   General appearance: Well appearing, and in no distress  Mental status: Alert, oriented to person, place, and time  Skin: Warm & dry  Cardiovascular: Normal heart rate noted  Respiratory: Normal respiratory effort, no distress  Abdomen: Soft, gravid, nontender  Pelvic: Cervical exam deferred         Extremities: Edema: None  Fetal Status: Fetal Heart Rate (bpm): 143 Fundal Height: 34 cm Movement: Present    No results found for this or any previous visit (from the past 24 hour(s)).  Assessment & Plan:  1) Low-risk pregnancy G3P1011 at [redacted]w[redacted]d with an Estimated Date of Delivery: 01/14/18   Meds: No orders of the defined types were placed in this encounter.  Labs/procedures today: none  Plan:  Continue routine obstetrical care   Reviewed: Preterm labor symptoms and general obstetric precautions including but not limited to  vaginal bleeding, contractions, leaking of fluid and fetal movement were reviewed in detail with the patient.  All questions were answered  Follow-up: Return in about 2 weeks (around 12/06/2017) for LROB.  Orders Placed This Encounter  Procedures  . POC Urinalysis Dipstick OB   Cheral Marker CNM, [facility] 11/22/2017 9:21 AM

## 2017-11-22 NOTE — Patient Instructions (Signed)
Tammie Black, I greatly value your feedback.  If you receive a survey following your visit with [place] today, we appreciate you taking the time to fill it out.  Thanks, [name], CNM, WHNP-BC   Call the office [phone]) or go to [facility] if:  You begin to have strong, frequent contractions  Your water breaks.  Sometimes it is a big gush of fluid, sometimes it is just a trickle that keeps getting your panties wet or running down your legs  You have vaginal bleeding.  It is normal to have a small amount of spotting if your cervix was checked.   You don't feel your baby moving like normal.  If you don't, get you something to eat and drink and lay down and focus on feeling your baby move.  You should feel at least 10 movements in 2 hours.  If you don't, you should call the office or go to [facility].    Tdap Vaccine  It is recommended that you get the Tdap vaccine during the third trimester of EACH pregnancy to help protect your baby from getting pertussis (whooping cough)  27-36 weeks is the BEST time to do this so that you can pass the protection on to your baby. During pregnancy is better than after pregnancy, but if you are unable to get it during pregnancy it will be offered at the hospital.   You can get this vaccine with [place], at the health department, your family doctor, or some local pharmacies  Everyone who will be around your baby should also be up-to-date on their vaccines before the baby comes. Adults (who are not pregnant) only need 1 dose of Tdap during adulthood.   Third Trimester of Pregnancy The third trimester is from week 29 through week 42, months 7 through 9. The third trimester is a time when the fetus is growing rapidly. At the end of the ninth month, the fetus is about 20 inches in length and weighs 6-10 pounds.  BODY CHANGES Your body goes through many changes during pregnancy. The changes vary from woman to woman.   Your weight will continue to  increase. You can expect to gain 25-35 pounds (11-16 kg) by the end of the pregnancy.  You may begin to get stretch marks on your hips, abdomen, and breasts.  You may urinate more often because the fetus is moving lower into your pelvis and pressing on your bladder.  You may develop or continue to have heartburn as a result of your pregnancy.  You may develop constipation because certain hormones are causing the muscles that push waste through your intestines to slow down.  You may develop hemorrhoids or swollen, bulging veins (varicose veins).  You may have pelvic pain because of the weight gain and pregnancy hormones relaxing your joints between the bones in your pelvis. Backaches may result from overexertion of the muscles supporting your posture.  You may have changes in your hair. These can include thickening of your hair, rapid growth, and changes in texture. Some women also have hair loss during or after pregnancy, or hair that feels dry or thin. Your hair will most likely return to normal after your baby is born.  Your breasts will continue to grow and be tender. A yellow discharge may leak from your breasts called colostrum.  Your belly button may stick out.  You may feel short of breath because of your expanding uterus.  You may notice the fetus "dropping," or moving lower in  your abdomen.  You may have a bloody mucus discharge. This usually occurs a few days to a week before labor begins.  Your cervix becomes thin and soft (effaced) near your due date. WHAT TO EXPECT AT YOUR PRENATAL EXAMS  You will have prenatal exams every 2 weeks until week 36. Then, you will have weekly prenatal exams. During a routine prenatal visit:  You will be weighed to make sure you and the fetus are growing normally.  Your blood pressure is taken.  Your abdomen will be measured to track your baby's growth.  The fetal heartbeat will be listened to.  Any test results from the previous visit  will be discussed.  You may have a cervical check near your due date to see if you have effaced. At around 36 weeks, your caregiver will check your cervix. At the same time, your caregiver will also perform a test on the secretions of the vaginal tissue. This test is to determine if a type of bacteria, Group B streptococcus, is present. Your caregiver will explain this further. Your caregiver may ask you:  What your birth plan is.  How you are feeling.  If you are feeling the baby move.  If you have had any abnormal symptoms, such as leaking fluid, bleeding, severe headaches, or abdominal cramping.  If you have any questions. Other tests or screenings that may be performed during your third trimester include:  Blood tests that check for low iron levels (anemia).  Fetal testing to check the health, activity level, and growth of the fetus. Testing is done if you have certain medical conditions or if there are problems during the pregnancy. FALSE LABOR You may feel small, irregular contractions that eventually go away. These are called Braxton Hicks contractions, or false labor. Contractions may last for hours, days, or even weeks before true labor sets in. If contractions come at regular intervals, intensify, or become painful, it is best to be seen by your caregiver.  SIGNS OF LABOR   Menstrual-like cramps.  Contractions that are 5 minutes apart or less.  Contractions that start on the top of the uterus and spread down to the lower abdomen and back.  A sense of increased pelvic pressure or back pain.  A watery or bloody mucus discharge that comes from the vagina. If you have any of these signs before the 37th week of pregnancy, call your caregiver right away. You need to go to the hospital to get checked immediately. HOME CARE INSTRUCTIONS   Avoid all smoking, herbs, alcohol, and unprescribed drugs. These chemicals affect the formation and growth of the baby.  Follow your  caregiver's instructions regarding medicine use. There are medicines that are either safe or unsafe to take during pregnancy.  Exercise only as directed by your caregiver. Experiencing uterine cramps is a good sign to stop exercising.  Continue to eat regular, healthy meals.  Wear a good support bra for breast tenderness.  Do not use hot tubs, steam rooms, or saunas.  Wear your seat belt at all times when driving.  Avoid raw meat, uncooked cheese, cat litter boxes, and soil used by cats. These carry germs that can cause birth defects in the baby.  Take your prenatal vitamins.  Try taking a stool softener (if your caregiver approves) if you develop constipation. Eat more high-fiber foods, such as fresh vegetables or fruit and whole grains. Drink plenty of fluids to keep your urine clear or pale yellow.  Take warm sitz baths to  soothe any pain or discomfort caused by hemorrhoids. Use hemorrhoid cream if your caregiver approves.  If you develop varicose veins, wear support hose. Elevate your feet for 15 minutes, 3-4 times a day. Limit salt in your diet.  Avoid heavy lifting, wear low heal shoes, and practice good posture.  Rest a lot with your legs elevated if you have leg cramps or low back pain.  Visit your dentist if you have not gone during your pregnancy. Use a soft toothbrush to brush your teeth and be gentle when you floss.  A sexual relationship may be continued unless your caregiver directs you otherwise.  Do not travel far distances unless it is absolutely necessary and only with the approval of your caregiver.  Take prenatal classes to understand, practice, and ask questions about the labor and delivery.  Make a trial run to the hospital.  Pack your hospital bag.  Prepare the baby's nursery.  Continue to go to all your prenatal visits as directed by your caregiver. SEEK MEDICAL CARE IF:  You are unsure if you are in labor or if your water has broken.  You have  dizziness.  You have mild pelvic cramps, pelvic pressure, or nagging pain in your abdominal area.  You have persistent nausea, vomiting, or diarrhea.  You have a bad smelling vaginal discharge.  You have pain with urination. SEEK IMMEDIATE MEDICAL CARE IF:   You have a fever.  You are leaking fluid from your vagina.  You have spotting or bleeding from your vagina.  You have severe abdominal cramping or pain.  You have rapid weight loss or gain.  You have shortness of breath with chest pain.  You notice sudden or extreme swelling of your face, hands, ankles, feet, or legs.  You have not felt your baby move in over an hour.  You have severe headaches that do not go away with medicine.  You have vision changes. Document Released: 01/13/2001 Document Revised: 01/24/2013 Document Reviewed: 03/22/2012 [facility] Patient Information 2015 [place], [place]. This information is not intended to replace advice given to you by your health care provider. Make sure you discuss any questions you have with your health care provider.

## 2017-12-06 ENCOUNTER — Ambulatory Visit (INDEPENDENT_AMBULATORY_CARE_PROVIDER_SITE_OTHER): Payer: BLUE CROSS/BLUE SHIELD | Admitting: Women's Health

## 2017-12-06 ENCOUNTER — Encounter: Payer: Self-pay | Admitting: Women's Health

## 2017-12-06 ENCOUNTER — Other Ambulatory Visit: Payer: Self-pay

## 2017-12-06 VITALS — BP 113/79 | HR 77 | Wt 303.0 lb

## 2017-12-06 DIAGNOSIS — Z1389 Encounter for screening for other disorder: Secondary | ICD-10-CM

## 2017-12-06 DIAGNOSIS — Z3A34 34 weeks gestation of pregnancy: Secondary | ICD-10-CM

## 2017-12-06 DIAGNOSIS — Z331 Pregnant state, incidental: Secondary | ICD-10-CM

## 2017-12-06 DIAGNOSIS — Z3483 Encounter for supervision of other normal pregnancy, third trimester: Secondary | ICD-10-CM

## 2017-12-06 LAB — POCT URINALYSIS DIPSTICK OB
Glucose, UA: NEGATIVE
Ketones, UA: NEGATIVE
Nitrite, UA: NEGATIVE
POC,PROTEIN,UA: NEGATIVE
RBC UA: NEGATIVE

## 2017-12-06 NOTE — Progress Notes (Signed)
   LOW-RISK PREGNANCY VISIT Patient name: Tammie Black MRN [id number]  Date of birth: 1995/03/09 Chief Complaint:   Routine Prenatal Visit  History of Present Illness:   Tammie Black is a 22 y.o. G94P1011 female at [redacted]w[redacted]d with an Estimated Date of Delivery: 01/14/18 being seen today for ongoing management of a low-risk pregnancy.  Today she reports no complaints. Contractions: Not present. Vag. Bleeding: None.  Movement: Present. denies leaking of fluid. Review of Systems:   Pertinent items are noted in HPI Denies abnormal vaginal discharge w/ itching/odor/irritation, headaches, visual changes, shortness of breath, chest pain, abdominal pain, severe nausea/vomiting, or problems with urination or bowel movements unless otherwise stated above. Pertinent History Reviewed:  Reviewed past medical,surgical, social, obstetrical and family history.  Reviewed problem list, medications and allergies. Physical Assessment:   Vitals:   12/06/17 0910  BP: 113/79  Pulse: 77  Weight: (!) 303 lb (137.4 kg)  Body mass index is 50.42 kg/m.        Physical Examination:   General appearance: Well appearing, and in no distress  Mental status: Alert, oriented to person, place, and time  Skin: Warm & dry  Cardiovascular: Normal heart rate noted  Respiratory: Normal respiratory effort, no distress  Abdomen: Soft, gravid, nontender  Pelvic: Cervical exam deferred         Extremities: Edema: None  Fetal Status: Fetal Heart Rate (bpm): 135 Fundal Height: 35 cm Movement: Present    Results for orders placed or performed in visit on 12/06/17 (from the past 24 hour(s))  POC Urinalysis Dipstick OB   Collection Time: 12/06/17  9:10 AM  Result Value Ref Range   Color, UA     Clarity, UA     Glucose, UA Negative Negative   Bilirubin, UA     Ketones, UA neg    Spec Grav, UA     Blood, UA neg    pH, UA     POC Protein UA Negative Negative, Trace   Urobilinogen, UA     Nitrite, UA neg    Leukocytes, UA  Small (1+) (A) Negative   Appearance     Odor      Assessment & Plan:  1) Low-risk pregnancy G3P1011 at [redacted]w[redacted]d with an Estimated Date of Delivery: 01/14/18    Meds: No orders of the defined types were placed in this encounter.  Labs/procedures today: none  Plan:  Continue routine obstetrical care   Reviewed: Preterm labor symptoms and general obstetric precautions including but not limited to vaginal bleeding, contractions, leaking of fluid and fetal movement were reviewed in detail with the patient.  All questions were answered  Follow-up: Return in about 15 days (around 12/21/2017) for LROB. and gbs  Orders Placed This Encounter  Procedures  . POC Urinalysis Dipstick OB   Cheral Marker CNM, [facility] 12/06/2017 9:47 AM

## 2017-12-06 NOTE — Patient Instructions (Signed)
Tammie Black, I greatly value your feedback.  If you receive a survey following your visit with [place] today, we appreciate you taking the time to fill it out.  Thanks, [name], CNM, WHNP-BC   Call the office [phone]) or go to [facility] if:  You begin to have strong, frequent contractions  Your water breaks.  Sometimes it is a big gush of fluid, sometimes it is just a trickle that keeps getting your panties wet or running down your legs  You have vaginal bleeding.  It is normal to have a small amount of spotting if your cervix was checked.   You don't feel your baby moving like normal.  If you don't, get you something to eat and drink and lay down and focus on feeling your baby move.  You should feel at least 10 movements in 2 hours.  If you don't, you should call the office or go to [facility].     [facility] Contractions Contractions of the uterus can occur throughout pregnancy, but they are not always a sign that you are in labor. You may have practice contractions called Braxton Hicks contractions. These false labor contractions are sometimes confused with true labor. What are Deberah Pelton contractions? Braxton Hicks contractions are tightening movements that occur in the muscles of the uterus before labor. Unlike true labor contractions, these contractions do not result in opening (dilation) and thinning of the cervix. Toward the end of pregnancy (32-34 weeks), Braxton Hicks contractions can happen more often and may become stronger. These contractions are sometimes difficult to tell apart from true labor because they can be very uncomfortable. You should not feel embarrassed if you go to the hospital with false labor. Sometimes, the only way to tell if you are in true labor is for your health care provider to look for changes in the cervix. The health care provider will do a physical exam and may monitor your contractions. If you are not in true labor, the exam should  show that your cervix is not dilating and your water has not broken. If there are other health problems associated with your pregnancy, it is completely safe for you to be sent home with false labor. You may continue to have Braxton Hicks contractions until you go into true labor. How to tell the difference between true labor and false labor True labor  Contractions last 30-70 seconds.  Contractions become very regular.  Discomfort is usually felt in the top of the uterus, and it spreads to the lower abdomen and low back.  Contractions do not go away with walking.  Contractions usually become more intense and increase in frequency.  The cervix dilates and gets thinner. False labor  Contractions are usually shorter and not as strong as true labor contractions.  Contractions are usually irregular.  Contractions are often felt in the front of the lower abdomen and in the groin.  Contractions may go away when you walk around or change positions while lying down.  Contractions get weaker and are shorter-lasting as time goes on.  The cervix usually does not dilate or become thin. Follow these instructions at home:  Take over-the-counter and prescription medicines only as told by your health care provider.  Keep up with your usual exercises and follow other instructions from your health care provider.  Eat and drink lightly if you think you are going into labor.  If Braxton Hicks contractions are making you uncomfortable: ? Change your position from lying down  or resting to walking, or change from walking to resting. ? Sit and rest in a tub of warm water. ? Drink enough fluid to keep your urine pale yellow. Dehydration may cause these contractions. ? Do slow and deep breathing several times an hour.  Keep all follow-up prenatal visits as told by your health care provider. This is important. Contact a health care provider if:  You have a fever.  You have continuous pain in  your abdomen. Get help right away if:  Your contractions become stronger, more regular, and closer together.  You have fluid leaking or gushing from your vagina.  You pass blood-tinged mucus (bloody show).  You have bleeding from your vagina.  You have low back pain that you never had before.  You feel your baby's head pushing down and causing pelvic pressure.  Your baby is not moving inside you as much as it used to. Summary  Contractions that occur before labor are called Braxton Hicks contractions, false labor, or practice contractions.  Braxton Hicks contractions are usually shorter, weaker, farther apart, and less regular than true labor contractions. True labor contractions usually become progressively stronger and regular and they become more frequent.  Manage discomfort from [facility] contractions by changing position, resting in a warm bath, drinking plenty of water, or practicing deep breathing. This information is not intended to replace advice given to you by your health care provider. Make sure you discuss any questions you have with your health care provider. Document Released: 06/04/2016 Document Revised: 06/04/2016 Document Reviewed: 06/04/2016 Elsevier Interactive Patient Education  2018 [employer].

## 2017-12-21 ENCOUNTER — Ambulatory Visit (INDEPENDENT_AMBULATORY_CARE_PROVIDER_SITE_OTHER): Payer: BLUE CROSS/BLUE SHIELD | Admitting: Advanced Practice Midwife

## 2017-12-21 ENCOUNTER — Encounter: Payer: Self-pay | Admitting: Advanced Practice Midwife

## 2017-12-21 ENCOUNTER — Other Ambulatory Visit: Payer: Self-pay

## 2017-12-21 VITALS — BP 119/87 | HR 103 | Wt 297.0 lb

## 2017-12-21 DIAGNOSIS — Z331 Pregnant state, incidental: Secondary | ICD-10-CM

## 2017-12-21 DIAGNOSIS — E66813 Obesity, class 3: Secondary | ICD-10-CM

## 2017-12-21 DIAGNOSIS — Z3483 Encounter for supervision of other normal pregnancy, third trimester: Secondary | ICD-10-CM

## 2017-12-21 DIAGNOSIS — Z1389 Encounter for screening for other disorder: Secondary | ICD-10-CM

## 2017-12-21 DIAGNOSIS — Z3A36 36 weeks gestation of pregnancy: Secondary | ICD-10-CM

## 2017-12-21 DIAGNOSIS — O368130 Decreased fetal movements, third trimester, not applicable or unspecified: Secondary | ICD-10-CM

## 2017-12-21 DIAGNOSIS — Z6841 Body Mass Index (BMI) 40.0 and over, adult: Secondary | ICD-10-CM

## 2017-12-21 LAB — POCT URINALYSIS DIPSTICK OB
Blood, UA: NEGATIVE
Glucose, UA: NEGATIVE
KETONES UA: NEGATIVE
Leukocytes, UA: NEGATIVE
NITRITE UA: NEGATIVE

## 2017-12-21 NOTE — Progress Notes (Signed)
  [id number] 5440w4d Estimated Date of Delivery: 01/14/18  Blood pressure 119/87, pulse (!) 103, weight 297 lb (134.7 kg), last menstrual period 03/05/2017.   BP weight and urine results all reviewed and noted.  Please refer to the obstetrical flow sheet for the fundal height and fetal heart rate documentation:  Patient reports decreased fetal movement, denies any bleeding and no rupture of membranes symptoms or regular contractions. Patient had stomach bug (N/V/D) over the weekend. Better now.  All questions were answered.   Physical Assessment:   Vitals:   12/21/17 1012  BP: 119/87  Pulse: (!) 103  Weight: 297 lb (134.7 kg)  Body mass index is 49.42 kg/m.        Physical Examination:   General appearance: Well appearing, and in no distress  Mental status: Alert, oriented to person, place, and time  Skin: Warm & dry  Cardiovascular: Normal heart rate noted  Respiratory: Normal respiratory effort, no distress  Abdomen: Soft, gravid, nontender  Pelvic: Cervical exam performed         Extremities: Edema: None  Fetal Status:   Fundal Height: 40 cm Movement: Present   NST: FHR baseline 145 bpm, Variability: moderate, Accelerations:present, Decelerations:  Absent= Cat 1/Reactive   Results for orders placed or performed in visit on 12/21/17 (from the past 24 hour(s))  POC Urinalysis Dipstick OB   Collection Time: 12/21/17 10:12 AM  Result Value Ref Range   Color, UA     Clarity, UA     Glucose, UA Negative Negative   Bilirubin, UA     Ketones, UA neg    Spec Grav, UA     Blood, UA neg    pH, UA     POC,PROTEIN,UA Trace Negative, Trace, Small (1+), Moderate (2+), Large (3+), 4+   Urobilinogen, UA     Nitrite, UA neg    Leukocytes, UA Negative Negative   Appearance     Odor       Orders Placed This Encounter  Procedures  . Culture, beta strep (group b only)  . GC/Chlamydia Probe Amp  . US OB Follow Up  . POC Urinalysis Dipstick OB    Plan:  Continued routine  obstetrical care, .  Return in about 1 week (around 12/28/2017) for LROB, US:EFW.  (obesity and size>dates)_

## 2017-12-21 NOTE — Patient Instructions (Signed)

## 2017-12-24 LAB — GC/CHLAMYDIA PROBE AMP
CHLAMYDIA, DNA PROBE: NEGATIVE
NEISSERIA GONORRHOEAE BY PCR: NEGATIVE

## 2017-12-25 LAB — CULTURE, BETA STREP (GROUP B ONLY): STREP GP B CULTURE: NEGATIVE

## 2017-12-28 ENCOUNTER — Other Ambulatory Visit: Payer: BLUE CROSS/BLUE SHIELD

## 2017-12-29 ENCOUNTER — Ambulatory Visit (INDEPENDENT_AMBULATORY_CARE_PROVIDER_SITE_OTHER): Payer: BLUE CROSS/BLUE SHIELD

## 2017-12-29 ENCOUNTER — Ambulatory Visit (INDEPENDENT_AMBULATORY_CARE_PROVIDER_SITE_OTHER): Payer: BLUE CROSS/BLUE SHIELD | Admitting: Advanced Practice Midwife

## 2017-12-29 VITALS — BP 112/76 | HR 99 | Wt 300.0 lb

## 2017-12-29 DIAGNOSIS — Z1389 Encounter for screening for other disorder: Secondary | ICD-10-CM

## 2017-12-29 DIAGNOSIS — Z3483 Encounter for supervision of other normal pregnancy, third trimester: Secondary | ICD-10-CM

## 2017-12-29 DIAGNOSIS — Z331 Pregnant state, incidental: Secondary | ICD-10-CM

## 2017-12-29 DIAGNOSIS — O99213 Obesity complicating pregnancy, third trimester: Secondary | ICD-10-CM | POA: Diagnosis not present

## 2017-12-29 DIAGNOSIS — Z6841 Body Mass Index (BMI) 40.0 and over, adult: Secondary | ICD-10-CM

## 2017-12-29 DIAGNOSIS — Z3A37 37 weeks gestation of pregnancy: Secondary | ICD-10-CM

## 2017-12-29 LAB — POCT URINALYSIS DIPSTICK OB
Blood, UA: NEGATIVE
GLUCOSE, UA: NEGATIVE
KETONES UA: NEGATIVE
Nitrite, UA: NEGATIVE
POC,PROTEIN,UA: NEGATIVE

## 2017-12-29 NOTE — Progress Notes (Signed)
  [id number] 6557w5d Estimated Date of Delivery: 01/14/18  Blood pressure 112/76, pulse 99, weight 300 lb (136.1 kg), last menstrual period 03/05/2017.   BP weight and urine results all reviewed and noted.  Please refer to the obstetrical flow sheet for the fundal height and fetal heart rate documentation:  Patient reports good fetal movement, denies any bleeding and no rupture of membranes symptoms or regular contractions. Patient is without complaints. All questions were answered.   Physical Assessment:   Vitals:   12/29/17 1208  BP: 112/76  Pulse: 99  Weight: 300 lb (136.1 kg)  Body mass index is 49.92 kg/m.        Physical Examination:   General appearance: Well appearing, and in no distress  Mental status: Alert, oriented to person, place, and time  Skin: Warm & dry  Cardiovascular: Normal heart rate noted  Respiratory: Normal respiratory effort, no distress  Abdomen: Soft, gravid, nontender  Pelvic: Cervical exam deferred         Extremities: Edema: None  Fetal Status:     Movement: Present   US 37+5 wks,cephalic,posterior placenta gr 2,afi 9 cm,FHR 146 bpm,normal ovaries bilat,efw 2988 g 33%  Results for orders placed or performed in visit on 12/29/17 (from the past 24 hour(s))  POC Urinalysis Dipstick OB   Collection Time: 12/29/17 12:06 PM  Result Value Ref Range   Color, UA     Clarity, UA     Glucose, UA Negative Negative   Bilirubin, UA     Ketones, UA neg    Spec Grav, UA     Blood, UA neg    pH, UA     POC,PROTEIN,UA Negative Negative, Trace, Small (1+), Moderate (2+), Large (3+), 4+   Urobilinogen, UA     Nitrite, UA neg    Leukocytes, UA Moderate (2+) (A) Negative   Appearance     Odor       Orders Placed This Encounter  Procedures  . POC Urinalysis Dipstick OB    Plan:  Continued routine obstetrical care,   Return in about 1 week (around 01/05/2018) for LROB.

## 2017-12-29 NOTE — Progress Notes (Addendum)
US 37+5 wks,cephalic,posterior placenta gr 2,afi 9 cm,FHR 146 bpm,normal ovaries bilat,efw 2988 g 33%

## 2017-12-29 NOTE — Patient Instructions (Signed)

## 2018-01-05 ENCOUNTER — Encounter: Payer: BLUE CROSS/BLUE SHIELD | Admitting: Advanced Practice Midwife

## 2018-01-05 MED ORDER — MAGNESIUM HYDROXIDE 400 MG/5ML PO SUSP
30.00 | ORAL | Status: DC
Start: ? — End: 2018-01-05

## 2018-01-05 MED ORDER — BENZOCAINE-MENTHOL 20-0.5 % EX AERO
INHALATION_SPRAY | CUTANEOUS | Status: DC
Start: ? — End: 2018-01-05

## 2018-01-05 MED ORDER — HYDROCODONE-ACETAMINOPHEN 5-325 MG PO TABS
1.00 | ORAL_TABLET | ORAL | Status: DC
Start: ? — End: 2018-01-05

## 2018-01-05 MED ORDER — LANOLIN EX OINT
TOPICAL_OINTMENT | CUTANEOUS | Status: DC
Start: ? — End: 2018-01-05

## 2018-01-05 MED ORDER — BISACODYL 10 MG RE SUPP
10.00 | RECTAL | Status: DC
Start: ? — End: 2018-01-05

## 2018-01-05 MED ORDER — MORPHINE SULFATE (PF) 4 MG/ML IV SOLN
10.00 | INTRAVENOUS | Status: DC
Start: ? — End: 2018-01-05

## 2018-01-05 MED ORDER — MORPHINE SULFATE (PF) 4 MG/ML IV SOLN
4.00 | INTRAVENOUS | Status: DC
Start: ? — End: 2018-01-05

## 2018-01-05 MED ORDER — HYDROCODONE-ACETAMINOPHEN 10-325 MG PO TABS
1.00 | ORAL_TABLET | ORAL | Status: DC
Start: ? — End: 2018-01-05

## 2018-01-05 MED ORDER — BENZOCAINE-MENTHOL 15-3.6 MG MT LOZG
1.00 | LOZENGE | OROMUCOSAL | Status: DC
Start: ? — End: 2018-01-05

## 2018-01-05 MED ORDER — SALINE NASAL SPRAY 0.65 % NA SOLN
2.00 | NASAL | Status: DC
Start: ? — End: 2018-01-05

## 2018-01-05 MED ORDER — GENERIC EXTERNAL MEDICATION
4.00 | Status: DC
Start: ? — End: 2018-01-05

## 2018-01-05 MED ORDER — GENERIC EXTERNAL MEDICATION
12.50 | Status: DC
Start: ? — End: 2018-01-05

## 2018-01-05 MED ORDER — ALUM & MAG HYDROXIDE-SIMETH 200-200-20 MG/5ML PO SUSP
30.00 | ORAL | Status: DC
Start: ? — End: 2018-01-05

## 2018-01-05 MED ORDER — GENERIC EXTERNAL MEDICATION
Status: DC
Start: ? — End: 2018-01-05

## 2018-01-05 MED ORDER — PRENATAL 19 PO TABS
1.00 | ORAL_TABLET | ORAL | Status: DC
Start: 2018-01-05 — End: 2018-01-05

## 2018-01-05 MED ORDER — GENERIC EXTERNAL MEDICATION
25.00 | Status: DC
Start: ? — End: 2018-01-05

## 2018-01-05 MED ORDER — DOCUSATE SODIUM 100 MG PO CAPS
100.00 | ORAL_CAPSULE | ORAL | Status: DC
Start: 2018-01-04 — End: 2018-01-05

## 2018-01-05 MED ORDER — IBUPROFEN 800 MG PO TABS
800.00 | ORAL_TABLET | ORAL | Status: DC
Start: ? — End: 2018-01-05

## 2018-01-05 MED ORDER — SIMETHICONE 80 MG PO CHEW
80.00 | CHEWABLE_TABLET | ORAL | Status: DC
Start: ? — End: 2018-01-05

## 2018-01-05 MED ORDER — IBUPROFEN 800 MG PO TABS
800.00 | ORAL_TABLET | ORAL | Status: DC
Start: 2018-01-04 — End: 2018-01-05

## 2018-01-05 MED ORDER — GUAIFENESIN 100 MG/5ML PO LIQD
200.00 | ORAL | Status: DC
Start: ? — End: 2018-01-05

## 2018-01-12 DIAGNOSIS — Z029 Encounter for administrative examinations, unspecified: Secondary | ICD-10-CM

## 2018-02-07 ENCOUNTER — Encounter: Payer: Self-pay | Admitting: Women's Health

## 2018-02-07 ENCOUNTER — Other Ambulatory Visit: Payer: Self-pay

## 2018-02-07 ENCOUNTER — Ambulatory Visit (INDEPENDENT_AMBULATORY_CARE_PROVIDER_SITE_OTHER): Payer: BLUE CROSS/BLUE SHIELD | Admitting: Women's Health

## 2018-02-07 DIAGNOSIS — Z3202 Encounter for pregnancy test, result negative: Secondary | ICD-10-CM

## 2018-02-07 DIAGNOSIS — Z30013 Encounter for initial prescription of injectable contraceptive: Secondary | ICD-10-CM

## 2018-02-07 LAB — POCT URINE PREGNANCY: Preg Test, Ur: NEGATIVE

## 2018-02-07 MED ORDER — MEDROXYPROGESTERONE ACETATE 150 MG/ML IM SUSP: 150.0000 mg | INTRAMUSCULAR | 3 refills | Status: DC

## 2018-02-07 NOTE — Patient Instructions (Signed)
NO SEX UNTIL AFTER YOU GET YOUR BIRTH CONTROL   Medroxyprogesterone injection [Contraceptive] What is this medicine? MEDROXYPROGESTERONE (me DROX ee proe JES te rone) contraceptive injections prevent pregnancy. They provide effective birth control for 3 months. Depo-subQ Provera 104 is also used for treating pain related to endometriosis. This medicine may be used for other purposes; ask your health care provider or pharmacist if you have questions. COMMON BRAND NAME(S): Depo-Provera, Depo-subQ Provera 104 What should I tell my health care provider before I take this medicine? They need to know if you have any of these conditions: -frequently drink alcohol -asthma -blood vessel disease or a history of a blood clot in the lungs or legs -bone disease such as osteoporosis -breast cancer -diabetes -eating disorder (anorexia nervosa or bulimia) -high blood pressure -HIV infection or AIDS -kidney disease -liver disease -mental depression -migraine -seizures (convulsions) -stroke -tobacco smoker -vaginal bleeding -an unusual or allergic reaction to medroxyprogesterone, other hormones, medicines, foods, dyes, or preservatives -pregnant or trying to get pregnant -breast-feeding How should I use this medicine? Depo-Provera Contraceptive injection is given into a muscle. Depo-subQ Provera 104 injection is given under the skin. These injections are given by a health care professional. You must not be pregnant before getting an injection. The injection is usually given during the first 5 days after the start of a menstrual period or 6 weeks after delivery of a baby. Talk to your pediatrician regarding the use of this medicine in children. Special care may be needed. These injections have been used in female children who have started having menstrual periods. Overdosage: If you think you have taken too much of this medicine contact a poison control center or emergency room at once. NOTE: This  medicine is only for you. Do not share this medicine with others. What if I miss a dose? Try not to miss a dose. You must get an injection once every 3 months to maintain birth control. If you cannot keep an appointment, call and reschedule it. If you wait longer than 13 weeks between Depo-Provera contraceptive injections or longer than 14 weeks between Depo-subQ Provera 104 injections, you could get pregnant. Use another method for birth control if you miss your appointment. You may also need a pregnancy test before receiving another injection. What may interact with this medicine? Do not take this medicine with any of the following medications: -bosentan This medicine may also interact with the following medications: -aminoglutethimide -antibiotics or medicines for infections, especially rifampin, rifabutin, rifapentine, and griseofulvin -aprepitant -barbiturate medicines such as phenobarbital or primidone -bexarotene -carbamazepine -medicines for seizures like ethotoin, felbamate, oxcarbazepine, phenytoin, topiramate -modafinil -St. John's wort This list may not describe all possible interactions. Give your health care provider a list of all the medicines, herbs, non-prescription drugs, or dietary supplements you use. Also tell them if you smoke, drink alcohol, or use illegal drugs. Some items may interact with your medicine. What should I watch for while using this medicine? This drug does not protect you against HIV infection (AIDS) or other sexually transmitted diseases. Use of this product may cause you to lose calcium from your bones. Loss of calcium may cause weak bones (osteoporosis). Only use this product for more than 2 years if other forms of birth control are not right for you. The longer you use this product for birth control the more likely you will be at risk for weak bones. Ask your health care professional how you can keep strong bones. You may have a change   in bleeding pattern  or irregular periods. Many females stop having periods while taking this drug. If you have received your injections on time, your chance of being pregnant is very low. If you think you may be pregnant, see your health care professional as soon as possible. Tell your health care professional if you want to get pregnant within the next year. The effect of this medicine may last a long time after you get your last injection. What side effects may I notice from receiving this medicine? Side effects that you should report to your doctor or health care professional as soon as possible: -allergic reactions like skin rash, itching or hives, swelling of the face, lips, or tongue -breast tenderness or discharge -breathing problems -changes in vision -depression -feeling faint or lightheaded, falls -fever -pain in the abdomen, chest, groin, or leg -problems with balance, talking, walking -unusually weak or tired -yellowing of the eyes or skin Side effects that usually do not require medical attention (report to your doctor or health care professional if they continue or are bothersome): -acne -fluid retention and swelling -headache -irregular periods, spotting, or absent periods -temporary pain, itching, or skin reaction at site where injected -weight gain This list may not describe all possible side effects. Call your doctor for medical advice about side effects. You may report side effects to FDA at 1-800-FDA-1088. Where should I keep my medicine? This does not apply. The injection will be given to you by a health care professional. NOTE: This sheet is a summary. It may not cover all possible information. If you have questions about this medicine, talk to your doctor, pharmacist, or health care provider.  2019 Elsevier/Gold Standard (2008-02-10 18:37:56)  

## 2018-02-07 NOTE — Progress Notes (Signed)
POSTPARTUM VISIT Patient name: Tammie Black MRN [id number]  Date of birth: 1995/02/09 Chief Complaint:   Postpartum Care  History of Present Illness:   Tammie Black is a 23 y.o. G18P1011 Caucasian female being seen today for a postpartum visit. She is 4 weeks postpartum following a spontaneous vaginal delivery at 38.2 gestational weeks at [facility]- closer for her, was 9cm when she got there. Anesthesia: none. Laceration: none. I have fully reviewed the prenatal and intrapartum course. Pregnancy uncomplicated. Postpartum course has been uncomplicated. Bleeding no bleeding. Bowel function is normal. Bladder function is normal.  Patient is sexually active. Last sexual activity: 02/06/18.  Contraception method is condoms and wants depo.  [place] Postpartum Depression Screening: negative. Score 0.   Last pap 05/20/15.  Results were normal .  Patient's last menstrual period was 03/05/2017 (approximate).  Baby's course has been uncomplicated. Baby is feeding by bottle.  Review of Systems:   Pertinent items are noted in HPI Denies Abnormal vaginal discharge w/ itching/odor/irritation, headaches, visual changes, shortness of breath, chest pain, abdominal pain, severe nausea/vomiting, or problems with urination or bowel movements. Pertinent History Reviewed:  Reviewed past medical,surgical, obstetrical and family history.  Reviewed problem list, medications and allergies. OB History  Gravida Para Term Preterm AB Living  3 2 2   1 2   SAB TAB Ectopic Multiple Live Births          2    # Outcome Date GA Lbr Len/2nd Weight Sex Delivery Anes PTL Lv  3 Term 01/02/18 [redacted]w[redacted]d  6 lb 8.8 oz (2.971 kg) F Vag-Spont None  LIV  2 Term 11/27/13 [redacted]w[redacted]d 15:24 / 04:31 8 lb 7 oz (3.827 kg) M Vag-Spont EPI N LIV  1 AB 2013           Physical Assessment:   Vitals:   02/07/18 1350  BP: 122/80  Pulse: 91  Weight: 300 lb (136.1 kg)  Height: 5\' 5"  (1.651 m)  Body mass index is 49.92 kg/m.       Physical  Examination:   General appearance: alert, well appearing, and in no distress  Mental status: alert, oriented to person, place, and time  Skin: warm & dry   Cardiovascular: normal heart rate noted   Respiratory: normal respiratory effort, no distress   Breasts: deferred, no complaints   Abdomen: soft, non-tender   Pelvic: VULVA: normal appearing vulva with no masses, tenderness or lesions, UTERUS: uterus is normal size, shape, consistency and nontender  Rectal: no hemorrhoids  Extremities: no edema       Results for orders placed or performed in visit on 02/07/18 (from the past 24 hour(s))  POCT urine pregnancy   Collection Time: 02/07/18  2:08 PM  Result Value Ref Range   Preg Test, Ur Negative Negative    Assessment & Plan:  1) Postpartum exam 2) 4 wks s/p SVB 3) Bottlefeeding 4) Depression screening 5) Contraception counseling, pt prefers abstinence until depo  Meds:  Meds ordered this encounter  Medications  . medroxyPROGESTERone (DEPO-PROVERA) 150 MG/ML injection    Sig: Inject 1 mL (150 mg total) into the muscle every 3 (three) months.    Dispense:  1 mL    Refill:  3    Order Specific Question:   Supervising Provider    Answer:   Lazaro Arms [[id number]]    Follow-up: Return in about 2 weeks (around 02/21/2018) for am hcg/ Depo injection pm; then later for 2nd depo and pap & physical.  Orders Placed This Encounter  Procedures  . POCT urine pregnancy    Cheral Marker CNM, [facility] 02/07/2018 2:12 PM

## 2018-02-21 ENCOUNTER — Other Ambulatory Visit: Payer: BLUE CROSS/BLUE SHIELD

## 2018-02-21 ENCOUNTER — Ambulatory Visit (INDEPENDENT_AMBULATORY_CARE_PROVIDER_SITE_OTHER): Payer: BLUE CROSS/BLUE SHIELD | Admitting: *Deleted

## 2018-02-21 ENCOUNTER — Other Ambulatory Visit: Payer: Self-pay

## 2018-02-21 ENCOUNTER — Encounter: Payer: Self-pay | Admitting: *Deleted

## 2018-02-21 ENCOUNTER — Other Ambulatory Visit: Payer: Self-pay | Admitting: Women's Health

## 2018-02-21 DIAGNOSIS — Z3042 Encounter for surveillance of injectable contraceptive: Secondary | ICD-10-CM

## 2018-02-21 LAB — BETA HCG QUANT (REF LAB): hCG Quant: <1 m[IU]/mL

## 2018-02-21 MED ORDER — MEDROXYPROGESTERONE ACETATE 150 MG/ML IM SUSP
150.0000 mg | Freq: Once | INTRAMUSCULAR | Status: AC
  Administered 2018-02-21: 150 mg via INTRAMUSCULAR

## 2018-02-21 NOTE — Progress Notes (Signed)
Pt given DepoProvera 150mg IM right deltoid without complications. Advised to return in 12 weeks for next injection. 

## 2018-02-23 ENCOUNTER — Telehealth: Payer: Self-pay | Admitting: Women's Health

## 2018-02-23 NOTE — Telephone Encounter (Signed)
Pt is requesting a note to return to work. Advised that I would type a note for her and fax it to [phone] as she requests. Pt verbalized understanding.

## 2018-02-23 NOTE — Telephone Encounter (Signed)
Patient called, requesting a return to work note w/no restrictions.  Needs by 02/27/2018.    Fax to [phone]  [phone]

## 2018-05-09 ENCOUNTER — Other Ambulatory Visit: Payer: Self-pay | Admitting: Women's Health

## 2018-05-09 ENCOUNTER — Other Ambulatory Visit: Payer: Self-pay

## 2018-05-09 ENCOUNTER — Ambulatory Visit: Payer: Medicaid Other | Admitting: *Deleted

## 2018-05-10 ENCOUNTER — Ambulatory Visit (INDEPENDENT_AMBULATORY_CARE_PROVIDER_SITE_OTHER): Payer: BC Managed Care – PPO | Admitting: *Deleted

## 2018-05-10 ENCOUNTER — Encounter: Payer: Self-pay | Admitting: *Deleted

## 2018-05-10 DIAGNOSIS — Z3042 Encounter for surveillance of injectable contraceptive: Secondary | ICD-10-CM | POA: Diagnosis not present

## 2018-05-10 MED ORDER — MEDROXYPROGESTERONE ACETATE 150 MG/ML IM SUSP
150.0000 mg | Freq: Once | INTRAMUSCULAR | Status: AC
  Administered 2018-05-10: 150 mg via INTRAMUSCULAR

## 2018-05-10 NOTE — Progress Notes (Signed)
Patient came on 4/6 for depo injection but did not bring injection with her. States she did not know she needed to bring it.  Informed she would need to call pharmacy and have them refill it, pick up and then bring with her tomorrow for injection.  Verbalized understanding.

## 2018-05-10 NOTE — Progress Notes (Signed)
Depo Provera 150mg IM given in left deltoid with no complications. Pt to return in 12 weeks for next injection.  

## 2018-06-30 ENCOUNTER — Other Ambulatory Visit: Payer: Medicaid Other | Admitting: Women's Health

## 2018-07-14 ENCOUNTER — Other Ambulatory Visit: Payer: Medicaid Other | Admitting: Women's Health

## 2018-08-01 ENCOUNTER — Ambulatory Visit: Payer: Medicaid Other

## 2018-08-04 ENCOUNTER — Ambulatory Visit (INDEPENDENT_AMBULATORY_CARE_PROVIDER_SITE_OTHER): Payer: BC Managed Care – PPO | Admitting: *Deleted

## 2018-08-04 ENCOUNTER — Other Ambulatory Visit: Payer: Self-pay

## 2018-08-04 DIAGNOSIS — Z3042 Encounter for surveillance of injectable contraceptive: Secondary | ICD-10-CM | POA: Diagnosis not present

## 2018-08-04 MED ORDER — MEDROXYPROGESTERONE ACETATE 150 MG/ML IM SUSP
150.0000 mg | Freq: Once | INTRAMUSCULAR | Status: AC
  Administered 2018-08-04: 11:00:00 150 mg via INTRAMUSCULAR

## 2018-08-04 NOTE — Progress Notes (Signed)
Depo Provera 150mg IM given in left deltoid with no complications. Pt to return in 12 weeks for next injection.  

## 2018-08-12 IMAGING — DX DG THORACIC SPINE 2V
2 series · 2 of 2 positions shown · non-contrast
Comparison: None.

CLINICAL DATA: Mid back pain, no injury

EXAM:
THORACIC SPINE 2 VIEWS

[t-spine ap]
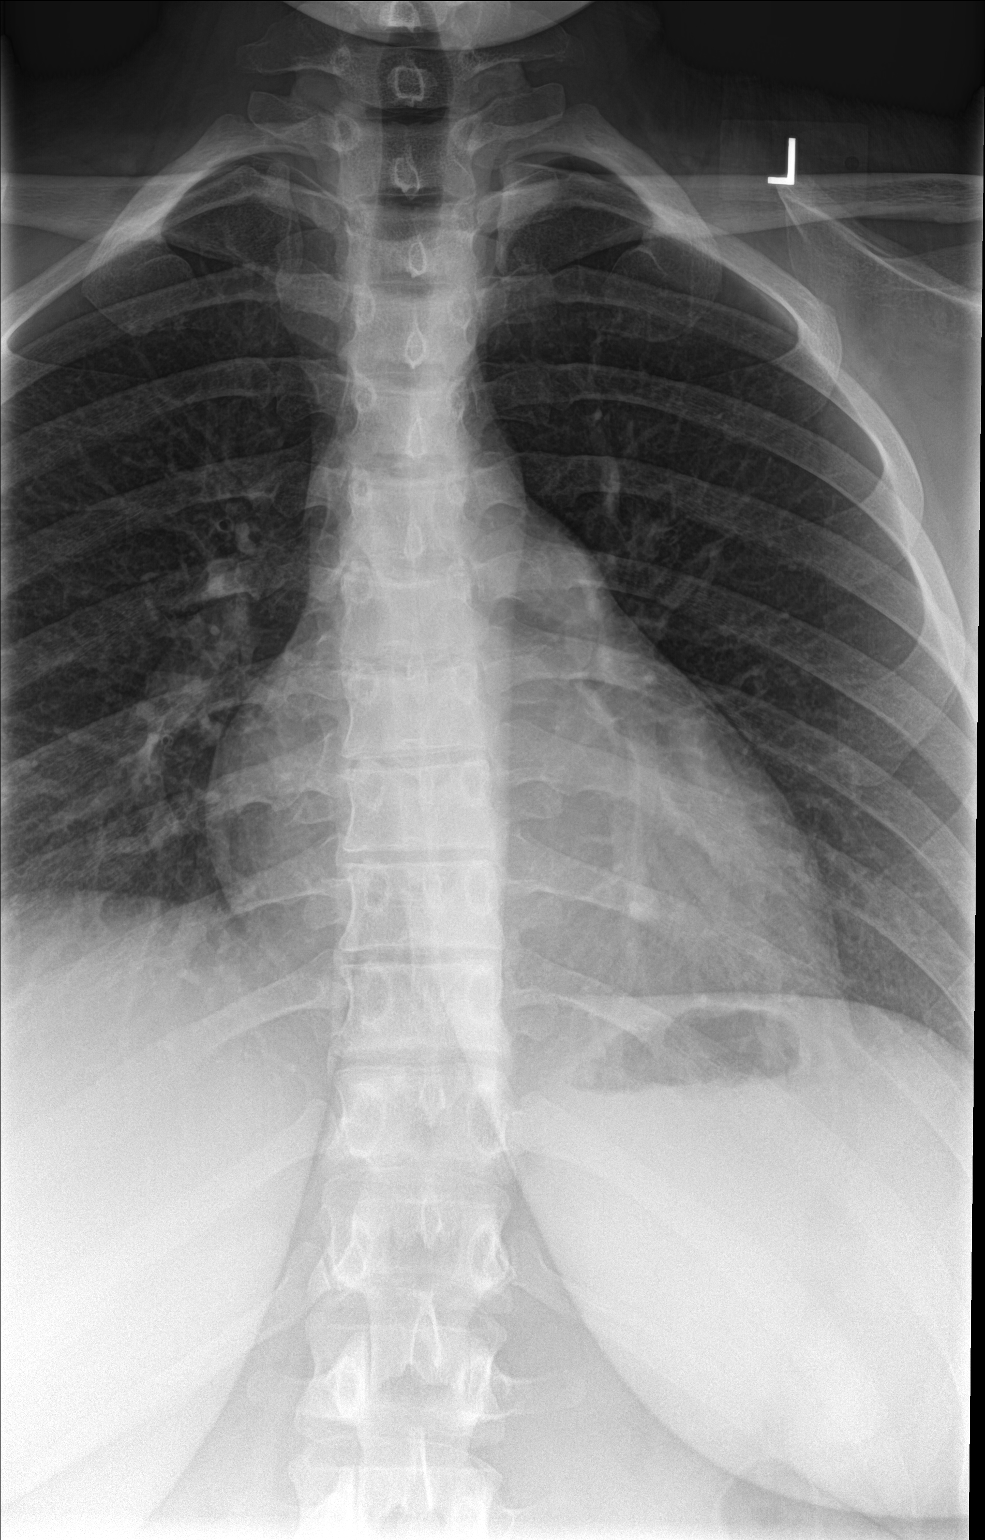

[t-spine lat]
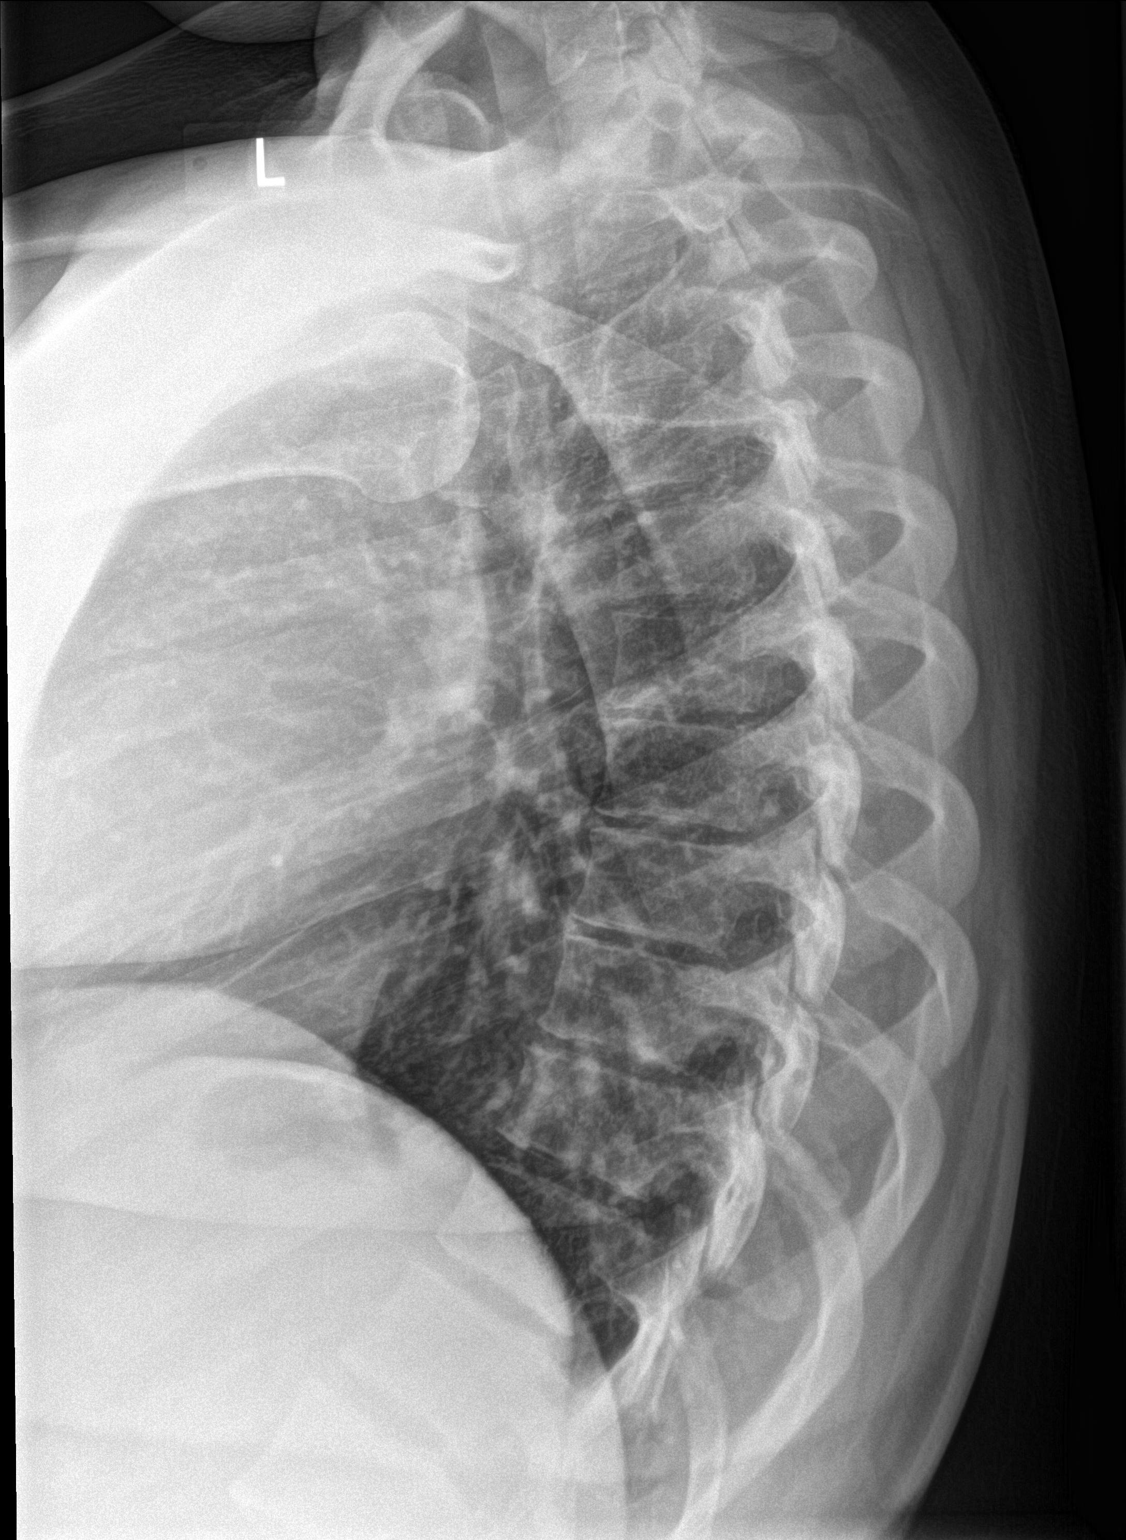

[2 of 2 positions shown; findings below may reference images not displayed]

FINDINGS: The thoracic vertebrae are in normal alignment. Intervertebral disc
spaces appear normal. No compression deformity is seen. No prominent
paravertebral soft tissue is noted.
IMPRESSION: Negative.

## 2018-08-25 ENCOUNTER — Other Ambulatory Visit: Payer: Medicaid Other | Admitting: Women's Health

## 2018-09-22 ENCOUNTER — Other Ambulatory Visit: Payer: Medicaid Other | Admitting: Women's Health

## 2018-10-27 ENCOUNTER — Ambulatory Visit: Payer: Medicaid Other

## 2018-10-31 ENCOUNTER — Ambulatory Visit: Payer: Medicaid Other

## 2018-11-10 ENCOUNTER — Ambulatory Visit: Payer: Medicaid Other

## 2018-11-10 ENCOUNTER — Other Ambulatory Visit: Payer: Medicaid Other

## 2018-11-10 ENCOUNTER — Other Ambulatory Visit: Payer: Self-pay

## 2018-11-10 DIAGNOSIS — Z3042 Encounter for surveillance of injectable contraceptive: Secondary | ICD-10-CM

## 2018-11-11 ENCOUNTER — Ambulatory Visit (INDEPENDENT_AMBULATORY_CARE_PROVIDER_SITE_OTHER): Payer: Medicaid Other | Admitting: *Deleted

## 2018-11-11 ENCOUNTER — Other Ambulatory Visit: Payer: Self-pay

## 2018-11-11 ENCOUNTER — Encounter: Payer: Self-pay | Admitting: *Deleted

## 2018-11-11 DIAGNOSIS — Z3042 Encounter for surveillance of injectable contraceptive: Secondary | ICD-10-CM | POA: Diagnosis not present

## 2018-11-11 LAB — BETA HCG QUANT (REF LAB): hCG Quant: <1 m[IU]/mL

## 2018-11-11 MED ORDER — MEDROXYPROGESTERONE ACETATE 150 MG/ML IM SUSP
150.0000 mg | Freq: Once | INTRAMUSCULAR | Status: AC
  Administered 2018-11-11: 150 mg via INTRAMUSCULAR

## 2018-11-11 NOTE — Progress Notes (Signed)
   NURSE VISIT- INJECTION  SUBJECTIVE:  Tammie Black is a 23 y.o. [phone] female here for a Depo Provera for contraception/period management. She is a GYN patient.   OBJECTIVE:  There were no vitals taken for this visit.  Appears well, in no apparent distress  Injection administered in: Right deltoid  No orders of the defined types were placed in this encounter.   ASSESSMENT: GYN patient Depo Provera for contraception/period management  PLAN: Follow-up: in 11-13 weeks for next Depo   Alice Rieger  11/11/2018 8:40 AM

## 2019-01-31 ENCOUNTER — Other Ambulatory Visit: Payer: Self-pay | Admitting: Women's Health

## 2019-02-06 ENCOUNTER — Ambulatory Visit (INDEPENDENT_AMBULATORY_CARE_PROVIDER_SITE_OTHER): Payer: Medicaid Other | Admitting: *Deleted

## 2019-02-06 ENCOUNTER — Other Ambulatory Visit: Payer: Self-pay

## 2019-02-06 ENCOUNTER — Encounter: Payer: Self-pay | Admitting: *Deleted

## 2019-02-06 VITALS — Ht 65.0 in

## 2019-02-06 DIAGNOSIS — Z3042 Encounter for surveillance of injectable contraceptive: Secondary | ICD-10-CM | POA: Diagnosis not present

## 2019-02-06 DIAGNOSIS — Z308 Encounter for other contraceptive management: Secondary | ICD-10-CM

## 2019-02-06 MED ORDER — MEDROXYPROGESTERONE ACETATE 150 MG/ML IM SUSP
150.0000 mg | Freq: Once | INTRAMUSCULAR | Status: AC
  Administered 2019-02-06: 09:00:00 150 mg via INTRAMUSCULAR

## 2019-02-06 NOTE — Progress Notes (Signed)
   NURSE VISIT- INJECTION  SUBJECTIVE:  Tammie Black is a 24 y.o. [phone] female here for a Depo Provera for contraception/period management. She is a GYN patient.   OBJECTIVE:  Ht 5\' 5"  (1.651 m)   Breastfeeding No   BMI 49.92 kg/m   Appears well, in no apparent distress  Injection administered in: Left deltoid  Meds ordered this encounter  Medications  . medroxyPROGESTERone (DEPO-PROVERA) injection 150 mg    ASSESSMENT: GYN patient Depo Provera for contraception/period management PLAN: Follow-up: in 11-13 weeks for next Depo   03-22-1971  02/06/2019 9:28 AM

## 2019-04-27 ENCOUNTER — Other Ambulatory Visit: Payer: Self-pay | Admitting: Women's Health

## 2019-05-01 ENCOUNTER — Ambulatory Visit: Payer: Medicaid Other

## 2019-05-01 ENCOUNTER — Telehealth: Payer: Self-pay | Admitting: Women's Health

## 2019-05-01 NOTE — Telephone Encounter (Signed)
Patient called stating that her pharmacy has sent over two request for a refill of her Depo but they have not received anything back from [place]. Please sent in refill pt's appointment is tomorrow @ 11:30 am. Thank you

## 2019-05-02 ENCOUNTER — Encounter: Payer: Self-pay | Admitting: *Deleted

## 2019-05-02 ENCOUNTER — Other Ambulatory Visit: Payer: Self-pay

## 2019-05-02 ENCOUNTER — Ambulatory Visit (INDEPENDENT_AMBULATORY_CARE_PROVIDER_SITE_OTHER): Payer: Medicaid Other | Admitting: *Deleted

## 2019-05-02 DIAGNOSIS — Z308 Encounter for other contraceptive management: Secondary | ICD-10-CM

## 2019-05-02 DIAGNOSIS — Z3042 Encounter for surveillance of injectable contraceptive: Secondary | ICD-10-CM

## 2019-05-02 MED ORDER — MEDROXYPROGESTERONE ACETATE 150 MG/ML IM SUSP
150.0000 mg | Freq: Once | INTRAMUSCULAR | Status: AC
  Administered 2019-05-02: 150 mg via INTRAMUSCULAR

## 2019-05-02 NOTE — Progress Notes (Signed)
   NURSE VISIT- INJECTION  SUBJECTIVE:  Tammie Black is a 24 y.o. [phone] female here for a Depo Provera for contraception/period management. She is a GYN patient.   OBJECTIVE:  There were no vitals taken for this visit.  Appears well, in no apparent distress  Injection administered in: Right deltoid  No orders of the defined types were placed in this encounter.   ASSESSMENT: GYN patient Depo Provera for contraception/period management PLAN: Follow-up: in 11-13 weeks for next Depo   Stoney Bang  05/02/2019 11:30 AM

## 2019-05-17 ENCOUNTER — Ambulatory Visit (INDEPENDENT_AMBULATORY_CARE_PROVIDER_SITE_OTHER): Payer: Medicaid Other | Admitting: Women's Health

## 2019-05-17 ENCOUNTER — Encounter: Payer: Self-pay | Admitting: Women's Health

## 2019-05-17 ENCOUNTER — Other Ambulatory Visit (HOSPITAL_COMMUNITY)
Admission: RE | Admit: 2019-05-17 | Discharge: 2019-05-17 | Disposition: A | Discharge status: Home / Self Care | Payer: Medicaid Other | Source: Ambulatory Visit | Attending: Obstetrics & Gynecology | Admitting: Obstetrics & Gynecology

## 2019-05-17 ENCOUNTER — Other Ambulatory Visit: Payer: Self-pay

## 2019-05-17 VITALS — BP 113/80 | HR 87 | Ht 65.0 in | Wt 369.6 lb

## 2019-05-17 DIAGNOSIS — Z113 Encounter for screening for infections with a predominantly sexual mode of transmission: Secondary | ICD-10-CM

## 2019-05-17 DIAGNOSIS — Z01419 Encounter for gynecological examination (general) (routine) without abnormal findings: Secondary | ICD-10-CM

## 2019-05-17 DIAGNOSIS — Z3009 Encounter for other general counseling and advice on contraception: Secondary | ICD-10-CM

## 2019-05-17 NOTE — Progress Notes (Signed)
WELL-WOMAN EXAMINATION Patient name: Tammie Black MRN [id number]  Date of birth: 13-May-1995 Chief Complaint:   Gynecologic Exam (pap/physcial)  History of Present Illness:   Tammie Black is a 24 y.o. G28P2012 Caucasian female being seen today for a routine FP [facility] well-woman exam.  Current complaints: none  Depression screen [facility] 2/9 05/17/2019 06/08/2017 05/10/2017 08/24/2016 05/21/2016  Decreased Interest 0 0 0 0 0  Down, Depressed, Hopeless 0 0 0 0 0  PHQ - 2 Score 0 0 0 0 0  Altered sleeping 0 0 - - -  Tired, decreased energy 0 0 - - -  Change in appetite 0 0 - - -  Feeling bad or failure about yourself  0 - - - -  Trouble concentrating 0 0 - - -  Moving slowly or fidgety/restless 0 - - - -  Suicidal thoughts 0 0 - - -  PHQ-9 Score 0 0 - - -     PCP: Shelah Lewandowsky @ [facility]      No LMP recorded. Patient has had an injection. The current method of family planning is Depo-Provera injections.  Last pap 05/20/15. Results were: normal. H/O abnormal pap: No Last mammogram: never. Results were: n/a. Family h/o breast cancer: No Last colonoscopy: never. Results were: n/a. Family h/o colorectal cancer: No Review of Systems:   Pertinent items are noted in HPI Denies any headaches, blurred vision, fatigue, shortness of breath, chest pain, abdominal pain, abnormal vaginal discharge/itching/odor/irritation, problems with periods, bowel movements, urination, or intercourse unless otherwise stated above. Pertinent History Reviewed:  Reviewed past medical,surgical, social and family history.  Reviewed problem list, medications and allergies. Physical Assessment:   Vitals:   05/17/19 0934  BP: 113/80  Pulse: 87  Weight: (!) 369 lb 9.6 oz (167.6 kg)  Height: 5\' 5"  (1.651 m)  Body mass index is 61.5 kg/m.        Physical Examination:   General appearance - well appearing, and in no distress  Mental status - alert, oriented to person, place, and time  Psych:  She has a normal mood and  affect  Skin - warm and dry, normal color, no suspicious lesions noted  Chest - effort normal, all lung fields clear to auscultation bilaterally  Heart - normal rate and regular rhythm  Neck:  midline trachea, no thyromegaly or nodules  Breasts - breasts appear normal, no suspicious masses, no skin or nipple changes or  axillary nodes  Abdomen - soft, nontender, nondistended, no masses or organomegaly  Pelvic - VULVA: normal appearing vulva with no masses, tenderness or lesions  VAGINA: normal appearing vagina with normal color and discharge, no lesions  CERVIX: normal appearing cervix without discharge or lesions, no CMT  Thin prep pap is done w/ HR HPV cotesting  UTERUS: uterus is felt to be normal size, shape, consistency and nontender   ADNEXA: No adnexal masses or tenderness noted.  Extremities:  No swelling or varicosities noted  Chaperone: Levy Pupa    No results found for this or any previous visit (from the past 24 hour(s)).  Assessment & Plan:  1) FP [facility] Well-Woman Exam  2) STD screen  Labs/procedures today: pap, labs  Mammogram @25yo  or sooner if problems Colonoscopy @25yo  or sooner if problems  Orders Placed This Encounter  Procedures  . HIV Antibody (routine testing w rflx)  . RPR    Meds: No orders of the defined types were placed in this encounter.   Follow-up: Return for q33mths depo, 38yr  physical.  Cheral Marker CNM, [facility] 05/17/2019 10:12 AM

## 2019-05-18 LAB — RPR: RPR Ser Ql: NONREACTIVE

## 2019-05-18 LAB — HIV ANTIBODY (ROUTINE TESTING W REFLEX): HIV Screen 4th Generation wRfx: NONREACTIVE

## 2019-05-19 LAB — CYTOLOGY - PAP
Comment: NEGATIVE
Diagnosis: NEGATIVE
High risk HPV: NEGATIVE

## 2019-07-10 ENCOUNTER — Other Ambulatory Visit: Payer: Self-pay | Admitting: Women's Health

## 2019-07-19 ENCOUNTER — Ambulatory Visit: Payer: Medicaid Other

## 2019-07-31 ENCOUNTER — Ambulatory Visit (INDEPENDENT_AMBULATORY_CARE_PROVIDER_SITE_OTHER): Payer: Medicaid Other | Admitting: *Deleted

## 2019-07-31 ENCOUNTER — Encounter: Payer: Self-pay | Admitting: *Deleted

## 2019-07-31 DIAGNOSIS — Z3042 Encounter for surveillance of injectable contraceptive: Secondary | ICD-10-CM | POA: Diagnosis not present

## 2019-07-31 MED ORDER — MEDROXYPROGESTERONE ACETATE 150 MG/ML IM SUSP
150.0000 mg | Freq: Once | INTRAMUSCULAR | Status: AC
  Administered 2019-07-31: 150 mg via INTRAMUSCULAR

## 2019-07-31 NOTE — Progress Notes (Signed)
   NURSE VISIT- INJECTION  SUBJECTIVE:  Tammie Black is a 24 y.o. [phone] female here for a Depo Provera for contraception/period management. She is a GYN patient.   OBJECTIVE:  There were no vitals taken for this visit.  Appears well, in no apparent distress  Injection administered in: Left deltoid  Meds ordered this encounter  Medications  . medroxyPROGESTERone (DEPO-PROVERA) injection 150 mg    ASSESSMENT: GYN patient Depo Provera for contraception/period management PLAN: Follow-up: in 11-13 weeks for next Depo   Jobe Marker  07/31/2019 9:10 AM

## 2019-10-23 ENCOUNTER — Ambulatory Visit (INDEPENDENT_AMBULATORY_CARE_PROVIDER_SITE_OTHER): Payer: Medicaid Other | Admitting: *Deleted

## 2019-10-23 DIAGNOSIS — Z3042 Encounter for surveillance of injectable contraceptive: Secondary | ICD-10-CM | POA: Diagnosis not present

## 2019-10-23 MED ORDER — MEDROXYPROGESTERONE ACETATE 150 MG/ML IM SUSP
150.0000 mg | Freq: Once | INTRAMUSCULAR | Status: AC
  Administered 2019-10-23: 150 mg via INTRAMUSCULAR

## 2019-10-23 NOTE — Progress Notes (Signed)
   NURSE VISIT- INJECTION  SUBJECTIVE:  Tammie Black is a 24 y.o. [phone] female here for a Depo Provera for contraception/period management. She is a GYN patient.   OBJECTIVE:  There were no vitals taken for this visit.  Appears well, in no apparent distress  Injection administered in: Right deltoid  Meds ordered this encounter  Medications  . medroxyPROGESTERone (DEPO-PROVERA) injection 150 mg    ASSESSMENT: GYN patient Depo Provera for contraception/period management PLAN: Follow-up: in 11-13 weeks for next Depo   Jobe Marker  10/23/2019 9:30 AM

## 2020-01-15 ENCOUNTER — Ambulatory Visit (INDEPENDENT_AMBULATORY_CARE_PROVIDER_SITE_OTHER): Payer: Medicaid Other | Admitting: *Deleted

## 2020-01-15 ENCOUNTER — Other Ambulatory Visit: Payer: Self-pay

## 2020-01-15 DIAGNOSIS — Z3042 Encounter for surveillance of injectable contraceptive: Secondary | ICD-10-CM

## 2020-01-15 MED ORDER — MEDROXYPROGESTERONE ACETATE 150 MG/ML IM SUSP
150.0000 mg | Freq: Once | INTRAMUSCULAR | Status: AC
Start: 2020-01-15 — End: 2020-01-15
  Administered 2020-01-15: 150 mg via INTRAMUSCULAR

## 2020-01-15 NOTE — Progress Notes (Signed)
   NURSE VISIT- INJECTION  SUBJECTIVE:  Tammie Black is a 24 y.o. [phone] female here for a Depo Provera for contraception/period management. She is a GYN patient.   OBJECTIVE:  There were no vitals taken for this visit.  Appears well, in no apparent distress  Injection administered in: Left deltoid  Meds ordered this encounter  Medications  . medroxyPROGESTERone (DEPO-PROVERA) injection 150 mg    ASSESSMENT: GYN patient Depo Provera for contraception/period management PLAN: Follow-up: in 11-13 weeks for next Depo   Jobe Marker  01/15/2020 12:26 PM

## 2020-03-29 ENCOUNTER — Other Ambulatory Visit: Payer: Self-pay | Admitting: Women's Health

## 2020-04-08 ENCOUNTER — Other Ambulatory Visit: Payer: Self-pay

## 2020-04-08 ENCOUNTER — Ambulatory Visit (INDEPENDENT_AMBULATORY_CARE_PROVIDER_SITE_OTHER): Payer: Medicaid Other | Admitting: *Deleted

## 2020-04-08 DIAGNOSIS — Z3042 Encounter for surveillance of injectable contraceptive: Secondary | ICD-10-CM

## 2020-04-08 DIAGNOSIS — Z308 Encounter for other contraceptive management: Secondary | ICD-10-CM

## 2020-04-08 MED ORDER — MEDROXYPROGESTERONE ACETATE 150 MG/ML IM SUSP
150.0000 mg | Freq: Once | INTRAMUSCULAR | Status: AC
  Administered 2020-04-08: 150 mg via INTRAMUSCULAR

## 2020-04-08 NOTE — Progress Notes (Signed)
   NURSE VISIT- INJECTION  SUBJECTIVE:  Tammie Black is a 25 y.o. [phone] female here for a Depo Provera for contraception/period management. She is a GYN patient.   OBJECTIVE:  There were no vitals taken for this visit.  Appears well, in no apparent distress  Injection administered in: Right deltoid  Meds ordered this encounter  Medications  . medroxyPROGESTERone (DEPO-PROVERA) injection 150 mg    ASSESSMENT: GYN patient Depo Provera for contraception/period management PLAN: Follow-up: in 11-13 weeks for next Depo   Malachy Mood  04/08/2020 9:30 AM

## 2020-06-24 ENCOUNTER — Other Ambulatory Visit: Payer: Self-pay | Admitting: Women's Health

## 2020-07-02 ENCOUNTER — Encounter: Payer: Self-pay | Admitting: Women's Health

## 2020-07-02 ENCOUNTER — Ambulatory Visit (INDEPENDENT_AMBULATORY_CARE_PROVIDER_SITE_OTHER): Payer: Medicaid Other | Admitting: Women's Health

## 2020-07-02 ENCOUNTER — Other Ambulatory Visit (HOSPITAL_COMMUNITY)
Admission: RE | Admit: 2020-07-02 | Discharge: 2020-07-02 | Disposition: A | Discharge status: Home / Self Care | Payer: Medicaid Other | Source: Ambulatory Visit | Attending: Obstetrics & Gynecology | Admitting: Obstetrics & Gynecology

## 2020-07-02 ENCOUNTER — Other Ambulatory Visit: Payer: Self-pay

## 2020-07-02 VITALS — BP 144/92 | HR 87 | Ht 65.0 in | Wt 376.2 lb

## 2020-07-02 DIAGNOSIS — Z0000 Encounter for general adult medical examination without abnormal findings: Secondary | ICD-10-CM | POA: Diagnosis present

## 2020-07-02 DIAGNOSIS — Z3042 Encounter for surveillance of injectable contraceptive: Secondary | ICD-10-CM

## 2020-07-02 DIAGNOSIS — R030 Elevated blood-pressure reading, without diagnosis of hypertension: Secondary | ICD-10-CM

## 2020-07-02 DIAGNOSIS — Z113 Encounter for screening for infections with a predominantly sexual mode of transmission: Secondary | ICD-10-CM

## 2020-07-02 DIAGNOSIS — Z3009 Encounter for other general counseling and advice on contraception: Secondary | ICD-10-CM

## 2020-07-02 DIAGNOSIS — R6882 Decreased libido: Secondary | ICD-10-CM

## 2020-07-02 MED ORDER — MEDROXYPROGESTERONE ACETATE 150 MG/ML IM SUSP
150.0000 mg | Freq: Once | INTRAMUSCULAR | Status: AC
  Administered 2020-07-02: 150 mg via INTRAMUSCULAR

## 2020-07-02 NOTE — Progress Notes (Signed)
WELL-WOMAN EXAMINATION Patient name: Tammie Black MRN [id number]  Date of birth: 02/09/1995 Chief Complaint:   Gynecologic Exam  History of Present Illness:   Tammie Black is a 25 y.o. G36P2012 Caucasian female being seen today for a routine FP [facility] well-woman exam.  Current complaints: decreased libido, fatigue- she and husband feel it could be hormonal/related to depo. Is affecting marriage. Denies dep/anx, not on antidepressants. Children sleep through the night. Discussed all options, may be interested in BTL. Wants to think about everything and let us know.   PCP: [facility]      No LMP recorded. Patient has had an injection. The current method of family planning is Depo-Provera injections.  Last pap 05/17/19. Results were: NILM w/ HRHPV negative. H/O abnormal pap: no Last mammogram: never. Results were: N/A. Family h/o breast cancer: no Last colonoscopy: never. Results were: N/A. Family h/o colorectal cancer: no  Depression screen [facility] 2/9 07/02/2020 05/17/2019 06/08/2017 05/10/2017 08/24/2016  Decreased Interest 0 0 0 0 0  Down, Depressed, Hopeless 0 0 0 0 0  PHQ - 2 Score 0 0 0 0 0  Altered sleeping 0 0 0 - -  Tired, decreased energy 1 0 0 - -  Change in appetite 0 0 0 - -  Feeling bad or failure about yourself  0 0 - - -  Trouble concentrating 0 0 0 - -  Moving slowly or fidgety/restless 0 0 - - -  Suicidal thoughts 0 0 0 - -  PHQ-9 Score 1 0 0 - -     GAD 7 : Generalized Anxiety Score 07/02/2020 05/17/2019  Nervous, Anxious, on Edge 0 0  Control/stop worrying 0 0  Worry too much - different things 0 0  Trouble relaxing 0 0  Restless 0 0  Easily annoyed or irritable 0 0  Afraid - awful might happen 0 0  Total GAD 7 Score 0 0     Review of Systems:   Pertinent items are noted in HPI Denies any headaches, blurred vision, fatigue, shortness of breath, chest pain, abdominal pain, abnormal vaginal discharge/itching/odor/irritation, problems with periods, bowel movements,  urination, or intercourse unless otherwise stated above. Pertinent History Reviewed:  Reviewed past medical,surgical, social and family history.  Reviewed problem list, medications and allergies. Physical Assessment:   Vitals:   07/02/20 0842  BP: (!) 144/92  Pulse: 87  Weight: (!) 376 lb 3.2 oz (170.6 kg)  Height: 5\' 5"  (1.651 m)  Body mass index is 62.6 kg/m.        Physical Examination:   General appearance - well appearing, and in no distress  Mental status - alert, oriented to person, place, and time  Psych:  She has a normal mood and affect  Skin - warm and dry, normal color, no suspicious lesions noted  Chest - effort normal, all lung fields clear to auscultation bilaterally  Heart - normal rate and regular rhythm  Neck:  midline trachea, no thyromegaly or nodules  Breasts - breasts appear normal, no suspicious masses, no skin or nipple changes or  axillary nodes  Abdomen - soft, nontender, nondistended, no masses or organomegaly  Pelvic - VULVA: normal appearing vulva with no masses, tenderness or lesions  VAGINA: normal appearing vagina with normal color and discharge, no lesions  CERVIX: normal appearing cervix without discharge or lesions, no CMT, CV swab collected  Thin prep pap is not done  UTERUS: uterus is felt to be normal size, shape, consistency and nontender   ADNEXA:  No adnexal masses or tenderness noted.  Extremities:  No swelling or varicosities noted  Chaperone: Faith Rogue    No results found for this or any previous visit (from the past 24 hour(s)).  Assessment & Plan:  1) [facility] Well-Woman Exam  2) STD screen> gc/ct/trich from CV swab, HIV/RPR   3) Contraception counseling and management> depo today  4) Decreased libido> pt/husband think may be r/t depo, discussed all options, may be interested in BTL. Wants to go home and think about it and will let us know  4) Elevated bp> make appt w/ PCP or f/u here next week for recheck w/  nurse  Labs/procedures today: as below  Mammogram: @ 26yo, or sooner if problems Colonoscopy: @ 25yo, or sooner if problems  Orders Placed This Encounter  Procedures  . HIV Antibody (routine testing w rflx)  . RPR    Meds: No orders of the defined types were placed in this encounter.   Follow-up: Return in about 1 year (around 07/02/2021) for Physical.  Cheral Marker CNM, WHNP-BC 07/02/2020 9:20 AM

## 2020-07-02 NOTE — Addendum Note (Signed)
Addended by: Annamarie Dawley on: 07/02/2020 09:29 AM   Modules accepted: Orders

## 2020-07-03 LAB — CERVICOVAGINAL ANCILLARY ONLY
Chlamydia: NEGATIVE
Comment: NEGATIVE
Comment: NEGATIVE
Comment: NORMAL
Neisseria Gonorrhea: NEGATIVE
Trichomonas: NEGATIVE

## 2020-07-03 LAB — RPR: RPR Ser Ql: NONREACTIVE

## 2020-07-03 LAB — HIV ANTIBODY (ROUTINE TESTING W REFLEX): HIV Screen 4th Generation wRfx: NONREACTIVE

## 2020-07-09 ENCOUNTER — Other Ambulatory Visit: Payer: Self-pay

## 2020-07-09 ENCOUNTER — Ambulatory Visit (INDEPENDENT_AMBULATORY_CARE_PROVIDER_SITE_OTHER): Payer: Self-pay

## 2020-07-09 VITALS — BP 150/98 | HR 90 | Ht 65.0 in | Wt 370.5 lb

## 2020-07-09 DIAGNOSIS — R030 Elevated blood-pressure reading, without diagnosis of hypertension: Secondary | ICD-10-CM

## 2020-07-09 NOTE — Progress Notes (Signed)
   NURSE VISIT- BLOOD PRESSURE CHECK  SUBJECTIVE:  Tammie Black is a 25 y.o. [phone] female here for BP check. She is a GYN patient    HYPERTENSION ROS:  GYN patient: . Taking medicines as instructed not applicable . Headaches  No . Chest pain No . Shortness of breath No . Swelling in legs/ankles No  OBJECTIVE:  BP (!) 150/98 (BP Location: Right Arm, Patient Position: Sitting, Cuff Size: Normal)   Pulse 90   Ht 5\' 5"  (1.651 m)   Wt (!) 370 lb 8 oz (168.1 kg)   BMI 61.65 kg/m   Appearance alert, well appearing, and in no distress and oriented to person, place, and time.  ASSESSMENT: GYN  blood pressure check  PLAN: Discussed with Dr.   Recommendations: Obtain BP cuff for home monitoring, contact PCP for f/u   Follow-up: PRN   Winner Valeriano A Saman Umstead  07/09/2020 9:32 AM

## 2020-07-29 ENCOUNTER — Encounter: Payer: Self-pay | Admitting: Women's Health

## 2020-07-29 ENCOUNTER — Ambulatory Visit (INDEPENDENT_AMBULATORY_CARE_PROVIDER_SITE_OTHER): Payer: Medicaid Other | Admitting: Women's Health

## 2020-07-29 ENCOUNTER — Other Ambulatory Visit: Payer: Self-pay

## 2020-07-29 VITALS — BP 144/96 | HR 62 | Ht 65.0 in | Wt 373.8 lb

## 2020-07-29 DIAGNOSIS — Z30430 Encounter for insertion of intrauterine contraceptive device: Secondary | ICD-10-CM | POA: Diagnosis not present

## 2020-07-29 DIAGNOSIS — I10 Essential (primary) hypertension: Secondary | ICD-10-CM

## 2020-07-29 DIAGNOSIS — Z3202 Encounter for pregnancy test, result negative: Secondary | ICD-10-CM | POA: Diagnosis not present

## 2020-07-29 LAB — POCT URINE PREGNANCY: Preg Test, Ur: NEGATIVE

## 2020-07-29 MED ORDER — AMLODIPINE BESYLATE 5 MG PO TABS: 5.0000 mg | ORAL_TABLET | Freq: Every day | ORAL | 3 refills | Status: DC

## 2020-07-29 MED ORDER — PARAGARD INTRAUTERINE COPPER IU IUD: INTRAUTERINE_SYSTEM | Freq: Once | INTRAUTERINE | Status: AC

## 2020-07-29 NOTE — Progress Notes (Signed)
IUD INSERTION Patient name: Tammie Black MRN [id number]  Date of birth: 1995-06-28 Subjective Findings:   Tammie Black is a 25 y.o. G38P2012 Caucasian female being seen today for insertion of a Paragard IUD.  No LMP recorded. Patient has had an injection. Last pap4/14/21. Results were: NILM w/ HRHPV negative  The risks and benefits of the method and placement have been thouroughly reviewed with the patient and all questions were answered.  Specifically the patient is aware of failure rate of 02/998, expulsion of the IUD and of possible perforation.  The patient is aware of irregular bleeding due to the method and understands the incidence of irregular bleeding diminishes with time.  Signed copy of informed consent in chart.   Depression screen [facility] 2/9 07/02/2020 05/17/2019 06/08/2017 05/10/2017 08/24/2016  Decreased Interest 0 0 0 0 0  Down, Depressed, Hopeless 0 0 0 0 0  PHQ - 2 Score 0 0 0 0 0  Altered sleeping 0 0 0 - -  Tired, decreased energy 1 0 0 - -  Change in appetite 0 0 0 - -  Feeling bad or failure about yourself  0 0 - - -  Trouble concentrating 0 0 0 - -  Moving slowly or fidgety/restless 0 0 - - -  Suicidal thoughts 0 0 0 - -  PHQ-9 Score 1 0 0 - -     GAD 7 : Generalized Anxiety Score 07/02/2020 05/17/2019  Nervous, Anxious, on Edge 0 0  Control/stop worrying 0 0  Worry too much - different things 0 0  Trouble relaxing 0 0  Restless 0 0  Easily annoyed or irritable 0 0  Afraid - awful might happen 0 0  Total GAD 7 Score 0 0     Pertinent History Reviewed:   Reviewed past medical,surgical, social, obstetrical and family history.  Reviewed problem list, medications and allergies. Objective Findings & Procedure:   Vitals:   07/29/20 1042  BP: (!) 144/96  Pulse: 62  Weight: (!) 373 lb 12.8 oz (169.6 kg)  Height: 5\' 5"  (1.651 m)  Body mass index is 62.2 kg/m.  Results for orders placed or performed in visit on 07/29/20 (from the past 24 hour(s))  POCT urine  pregnancy   Collection Time: 07/29/20 12:05 PM  Result Value Ref Range   Preg Test, Ur Negative Negative     Time out was performed.  A graves speculum was placed in the vagina.  The cervix was visualized, prepped using Betadine, and grasped with a single tooth tenaculum. The uterus was found to be neutral and it sounded to 8 cm.  Paragard  IUD placed per manufacturer's recommendations. The strings were trimmed to approximately 3 cm. The patient tolerated the procedure well.   Informal transvaginal sonogram was performed and the proper placement of the IUD was verified.  Chaperone: 07/31/20 & Plan:   1) Paragard IUD insertion The patient was given post procedure instructions, including signs and symptoms of infection and to check for the strings after each menses or each month, and refraining from intercourse or anything in the vagina for 3 days. She was given a care card with date IUD placed, and date IUD to be removed. She is scheduled for a f/u appointment in 4 weeks.  2) CHTN> multiple elevated bp's, has FP [facility] so hasn't been able to make appt w/ PCP. Rx norvasc 5mg , will recheck @ f/u in 4wks. Take at least 1hr prior to appt.  Orders Placed This Encounter  Procedures   POCT urine pregnancy    Return in about 4 weeks (around 08/26/2020) for IUD f/u, bp check, CNM, in person.  Cheral Marker CNM, [facility] 07/29/2020 12:47 PM

## 2020-07-29 NOTE — Patient Instructions (Addendum)
GoodRx  Nothing in vagina for 3 days (no sex, douching, tampons, etc...) Check your strings once a month to make sure you can feel them, if you are not able to please let us know If you develop a fever of 100.4 or more in the next few weeks, or if you develop severe abdominal pain, please let [place] know Use a backup method of birth control, such as condoms, for 2 weeks   Intrauterine Device Insertion, Care After This sheet gives you information about how to care for yourself after your procedure. Your health care provider may also give you more specific instructions. If you have problems or questions, contact your health careprovider. What can I expect after the procedure? After the procedure, it is common to have: Cramps and pain in the abdomen. Bleeding. It may be light or heavy. This may last for a few days. Lower back pain. Dizziness. Headaches. Nausea. Follow these instructions at home:  Before resuming sexual activity, check to make sure that you can feel the IUD string or strings. You should be able to feel the end of the string below the opening of your cervix. If your IUD string is in place, you may resume sexual activity. If you had a hormonal IUD inserted more than 7 days after your most recent period started, you will need to use a backup method of birth control for 7 days after IUD insertion. Ask your health care provider whether this applies to you. Continue to check that the IUD is still in place by feeling for the strings after every menstrual period, or once a month. An IUD will not protect you from sexually transmitted infections (STIs). Use methods to prevent the exchange of body fluids between partners (barrier protection) every time you have sex. Barrier protection can be used during oral, vaginal, or anal sex. Commonly used barrier methods include: Female condom. Female condom. Dental dam. Take over-the-counter and prescription medicines only as told by your health care  provider. Keep all follow-up visits as told by your health care provider. This is important. Contact a health care provider if: You feel light-headed or weak. You have any of the following problems with your IUD string or strings: The string bothers or hurts you or your sexual partner. You cannot feel the string. The string has gotten longer. You can feel the IUD in your vagina. You think you may be pregnant, or you miss your menstrual period. You think you may have a sexually transmitted infection (STI). Get help right away if: You have flu-like symptoms, such as tiredness (fatigue) and muscle aches. You have a fever and chills. You have bleeding that is heavier or lasts longer than a normal menstrual cycle. You have abnormal or bad-smelling discharge from your vagina. You develop abdominal pain that is new, is getting worse, or is not in the same area of earlier cramping and pain. You have pain during sexual activity. Summary After the procedure, it is common to have cramps and pain in the abdomen. It is also common to have light bleeding or heavier bleeding that is like your menstrual period. Continue to check that the IUD is still in place by feeling for the strings after every menstrual period, or once a month. Keep all follow-up visits as told by your health care provider. This is important. Contact your health care provider if you have problems with your IUD strings, such as the string getting longer or bothering you or your sexual partner. This information is  not intended to replace advice given to you by your health care provider. Make sure you discuss any questions you have with your healthcare provider. Document Revised: 01/10/2019 Document Reviewed: 01/10/2019 Elsevier Patient Education  2022 [employer].

## 2020-08-28 ENCOUNTER — Ambulatory Visit (INDEPENDENT_AMBULATORY_CARE_PROVIDER_SITE_OTHER): Payer: Medicaid Other | Admitting: Advanced Practice Midwife

## 2020-08-28 ENCOUNTER — Encounter: Payer: Self-pay | Admitting: Advanced Practice Midwife

## 2020-08-28 ENCOUNTER — Other Ambulatory Visit: Payer: Self-pay

## 2020-08-28 VITALS — BP 132/94 | HR 79 | Wt 373.0 lb

## 2020-08-28 DIAGNOSIS — Z30431 Encounter for routine checking of intrauterine contraceptive device: Secondary | ICD-10-CM

## 2020-08-28 DIAGNOSIS — I10 Essential (primary) hypertension: Secondary | ICD-10-CM | POA: Diagnosis not present

## 2020-08-28 MED ORDER — AMLODIPINE BESYLATE 10 MG PO TABS: 10.0000 mg | ORAL_TABLET | Freq: Every day | ORAL | 1 refills | Status: DC

## 2020-08-28 NOTE — Progress Notes (Signed)
   GYN VISIT Patient name: Tammie Black MRN [id number]  Date of birth: 1995/11/01 Chief Complaint:   IUD check  History of Present Illness:   Tammie Black is a 25 y.o. G79P2012 Caucasian female being seen today for IUD placement check.  Has been having spotting x past 2wks.  Of note, she was started on Norvasc 5mg  at the time of IUD placement due to cHTN with FP MCD. No LMP recorded. (Menstrual status: IUD). The current method of family planning is IUD.  Last pap April 2021. Results were: NILM w/ HRHPV negative  Depression screen [facility] 2/9 07/02/2020 05/17/2019 06/08/2017 05/10/2017 08/24/2016  Decreased Interest 0 0 0 0 0  Down, Depressed, Hopeless 0 0 0 0 0  PHQ - 2 Score 0 0 0 0 0  Altered sleeping 0 0 0 - -  Tired, decreased energy 1 0 0 - -  Change in appetite 0 0 0 - -  Feeling bad or failure about yourself  0 0 - - -  Trouble concentrating 0 0 0 - -  Moving slowly or fidgety/restless 0 0 - - -  Suicidal thoughts 0 0 0 - -  PHQ-9 Score 1 0 0 - -     GAD 7 : Generalized Anxiety Score 07/02/2020 05/17/2019  Nervous, Anxious, on Edge 0 0  Control/stop worrying 0 0  Worry too much - different things 0 0  Trouble relaxing 0 0  Restless 0 0  Easily annoyed or irritable 0 0  Afraid - awful might happen 0 0  Total GAD 7 Score 0 0     Review of Systems:   Pertinent items are noted in HPI Denies fever/chills, dizziness, headaches, visual disturbances, fatigue, shortness of breath, chest pain, abdominal pain, vomiting, abnormal vaginal discharge/itching/odor/irritation, problems with periods, bowel movements, urination, or intercourse unless otherwise stated above.  Pertinent History Reviewed:  Reviewed past medical,surgical, social, obstetrical and family history.  Reviewed problem list, medications and allergies. Physical Assessment:   Vitals:   08/28/20 0851  BP: (!) 132/94  Pulse: 79  Weight: (!) 373 lb (169.2 kg)  Body mass index is 62.07 kg/m.       Physical Examination:    General appearance: alert, well appearing, and in no distress  Mental status: alert, oriented to person, place, and time  Skin: warm & dry   Cardiovascular: normal heart rate noted  Respiratory: normal respiratory effort, no distress  Abdomen: soft, non-tender; informal u/s with IUD visible in fundus  Pelvic: normal external genitalia, vulva, vagina, cervix, uterus and adnexa; strings visualized ~2-3cm from os  Extremities: no edema    No results found for this or any previous visit (from the past 24 hour(s)).  Assessment & Plan:  1) IUD check> placement verified with SSE and informal u/s  2) cHTN> dosing on Norvasc increased from 5mg  to 10mg ; will have RN BP check in 2 mos  Meds:  Meds ordered this encounter  Medications   amLODipine (NORVASC) 10 MG tablet    Sig: Take 1 tablet (10 mg total) by mouth daily.    Dispense:  90 tablet    Refill:  1    Order Specific Question:   Supervising Provider    Answer:   08/30/20, LUTHER H [[id number]]    No orders of the defined types were placed in this encounter.   Return for RN BP check 2 months; physical 33yr KB.  CNM 08/28/2020 9:26 AM

## 2020-09-20 ENCOUNTER — Ambulatory Visit: Payer: Medicaid Other

## 2021-02-18 ENCOUNTER — Ambulatory Visit (INDEPENDENT_AMBULATORY_CARE_PROVIDER_SITE_OTHER): Payer: Medicaid Other | Admitting: Women's Health

## 2021-02-18 ENCOUNTER — Other Ambulatory Visit: Payer: Self-pay

## 2021-02-18 ENCOUNTER — Encounter: Payer: Self-pay | Admitting: Women's Health

## 2021-02-18 ENCOUNTER — Other Ambulatory Visit (HOSPITAL_COMMUNITY)
Admission: RE | Admit: 2021-02-18 | Discharge: 2021-02-18 | Disposition: A | Discharge status: Home / Self Care | Payer: Medicaid Other | Source: Ambulatory Visit | Attending: Women's Health | Admitting: Women's Health

## 2021-02-18 VITALS — BP 130/78 | HR 89 | Ht 65.0 in | Wt 377.2 lb

## 2021-02-18 DIAGNOSIS — Z975 Presence of (intrauterine) contraceptive device: Secondary | ICD-10-CM | POA: Diagnosis not present

## 2021-02-18 DIAGNOSIS — N921 Excessive and frequent menstruation with irregular cycle: Secondary | ICD-10-CM

## 2021-02-18 DIAGNOSIS — Z3202 Encounter for pregnancy test, result negative: Secondary | ICD-10-CM | POA: Diagnosis not present

## 2021-02-18 LAB — POCT URINE PREGNANCY: Preg Test, Ur: NEGATIVE

## 2021-02-18 NOTE — Progress Notes (Signed)
GYN VISIT Patient name: Tammie Black MRN [id number]  Date of birth: [birth year] Chief Complaint:   having abnormal bleeding with Paragard  History of Present Illness:   Tammie Black is a 26 y.o. G32P2012 Caucasian female being seen today for report of irregular bleeding w/ IUD.  Paragard IUD inserted 07/29/20. Spotting on and off until Oct, then nothing in morning or at night, but would bleed during day. Nov and Dec bled every day, until 12/17 when it stopped for about a week. Denies abnormal discharge, itching/odor/irritation.  No pain or pain w/ sex. Able to feel strings. Reports periods were irregular prior to IUD but not this bad. No LMP recorded. (Menstrual status: IUD). The current method of family planning is IUD.  Last pap 05/17/19. Results were: NILM w/ HRHPV negative  Depression screen [facility] 2/9 07/02/2020 05/17/2019 06/08/2017 05/10/2017 08/24/2016  Decreased Interest 0 0 0 0 0  Down, Depressed, Hopeless 0 0 0 0 0  PHQ - 2 Score 0 0 0 0 0  Altered sleeping 0 0 0 - -  Tired, decreased energy 1 0 0 - -  Change in appetite 0 0 0 - -  Feeling bad or failure about yourself  0 0 - - -  Trouble concentrating 0 0 0 - -  Moving slowly or fidgety/restless 0 0 - - -  Suicidal thoughts 0 0 0 - -  PHQ-9 Score 1 0 0 - -     GAD 7 : Generalized Anxiety Score 07/02/2020 05/17/2019  Nervous, Anxious, on Edge 0 0  Control/stop worrying 0 0  Worry too much - different things 0 0  Trouble relaxing 0 0  Restless 0 0  Easily annoyed or irritable 0 0  Afraid - awful might happen 0 0  Total GAD 7 Score 0 0     Review of Systems:   Pertinent items are noted in HPI Denies fever/chills, dizziness, headaches, visual disturbances, fatigue, shortness of breath, chest pain, abdominal pain, vomiting, abnormal vaginal discharge/itching/odor/irritation, problems with periods, bowel movements, urination, or intercourse unless otherwise stated above.  Pertinent History Reviewed:  Reviewed past  medical,surgical, social, obstetrical and family history.  Reviewed problem list, medications and allergies. Physical Assessment:   Vitals:   02/18/21 0855  BP: 130/78  Pulse: 89  Weight: (!) 377 lb 3.2 oz (171.1 kg)  Height: 5\' 5"  (1.651 m)  Body mass index is 62.77 kg/m.       Physical Examination:   General appearance: alert, well appearing, and in no distress  Mental status: alert, oriented to person, place, and time  Skin: warm & dry   Cardiovascular: normal heart rate noted  Respiratory: normal respiratory effort, no distress  Abdomen: soft, non-tender   Pelvic: VULVA: normal appearing vulva with no masses, tenderness or lesions, VAGINA: normal appearing vagina with normal color and discharge, no lesions, small amt menstrual blood CERVIX: normal appearing cervix without discharge or lesions, IUD strings visible and appropriate length  Extremities: no edema   Chaperone:    Results for orders placed or performed in visit on 02/18/21 (from the past 24 hour(s))  POCT urine pregnancy   Collection Time: 02/18/21  8:58 AM  Result Value Ref Range   Preg Test, Ur Negative Negative    Assessment & Plan:  1) Irregular bleeding w/ IUD> CV swab, if neg will try megace  Meds: No orders of the defined types were placed in this encounter.   Orders Placed This Encounter  Procedures  POCT urine pregnancy    No follow-ups on file.  Cheral Marker CNM, [facility] 02/18/2021 9:36 AM

## 2021-02-19 LAB — CERVICOVAGINAL ANCILLARY ONLY
Bacterial Vaginitis (gardnerella): POSITIVE — AB
Candida Glabrata: NEGATIVE
Candida Vaginitis: NEGATIVE
Chlamydia: NEGATIVE
Comment: NEGATIVE
Comment: NEGATIVE
Comment: NEGATIVE
Comment: NEGATIVE
Comment: NEGATIVE
Comment: NORMAL
Neisseria Gonorrhea: NEGATIVE
Trichomonas: NEGATIVE

## 2021-02-19 MED ORDER — METRONIDAZOLE 500 MG PO TABS: 500.0000 mg | ORAL_TABLET | Freq: Two times a day (BID) | ORAL | 0 refills | Status: DC

## 2021-02-19 NOTE — Addendum Note (Signed)
Addended by: Cheral Marker on: 02/19/2021 01:36 PM   Modules accepted: Orders

## 2021-03-19 ENCOUNTER — Other Ambulatory Visit: Payer: Self-pay

## 2021-03-19 ENCOUNTER — Ambulatory Visit (INDEPENDENT_AMBULATORY_CARE_PROVIDER_SITE_OTHER): Payer: Medicaid Other | Admitting: Women's Health

## 2021-03-19 ENCOUNTER — Encounter: Payer: Self-pay | Admitting: Women's Health

## 2021-03-19 VITALS — BP 138/90 | HR 92 | Ht 65.0 in | Wt 373.4 lb

## 2021-03-19 DIAGNOSIS — Z30432 Encounter for removal of intrauterine contraceptive device: Secondary | ICD-10-CM | POA: Diagnosis not present

## 2021-03-19 DIAGNOSIS — Z30017 Encounter for initial prescription of implantable subdermal contraceptive: Secondary | ICD-10-CM | POA: Insufficient documentation

## 2021-03-19 MED ORDER — ETONOGESTREL 68 MG ~~LOC~~ IMPL
68.0000 mg | DRUG_IMPLANT | Freq: Once | SUBCUTANEOUS | Status: AC
  Administered 2021-03-19: 68 mg via SUBCUTANEOUS

## 2021-03-19 NOTE — Progress Notes (Signed)
IUD REMOVAL  Patient name: Tammie Black MRN [id number]  Date of birth: [birth year] Subjective Findings:   Tammie Black is a 26 y.o. G104P2012 Caucasian female being seen today for removal of a Paragard  IUD. Her IUD was placed 07/29/20.  She desires removal because of bleeding, does not want to trial megace. CV swab +BV 1/17, treated and bleeding did not improve. Signed copy of informed consent in chart.  No LMP recorded. (Menstrual status: IUD). Last pap4/14/21. Results were: NILM w/ HRHPV negative The planned method of family planning is Nexplanon  Depression screen [facility] 2/9 07/02/2020 05/17/2019 06/08/2017 05/10/2017 08/24/2016  Decreased Interest 0 0 0 0 0  Down, Depressed, Hopeless 0 0 0 0 0  PHQ - 2 Score 0 0 0 0 0  Altered sleeping 0 0 0 - -  Tired, decreased energy 1 0 0 - -  Change in appetite 0 0 0 - -  Feeling bad or failure about yourself  0 0 - - -  Trouble concentrating 0 0 0 - -  Moving slowly or fidgety/restless 0 0 - - -  Suicidal thoughts 0 0 0 - -  PHQ-9 Score 1 0 0 - -     GAD 7 : Generalized Anxiety Score 07/02/2020 05/17/2019  Nervous, Anxious, on Edge 0 0  Control/stop worrying 0 0  Worry too much - different things 0 0  Trouble relaxing 0 0  Restless 0 0  Easily annoyed or irritable 0 0  Afraid - awful might happen 0 0  Total GAD 7 Score 0 0     Pertinent History Reviewed:   Reviewed past medical,surgical, social, obstetrical and family history.  Reviewed problem list, medications and allergies. Objective Findings & Procedure:    Vitals:   03/19/21 1056  BP: 138/90  Pulse: 92  Weight: (!) 373 lb 6.4 oz (169.4 kg)  Height: 5\' 5"  (1.651 m)  Body mass index is 62.14 kg/m.  No results found for this or any previous visit (from the past 24 hour(s)).   Time out was performed.  A graves speculum was placed in the vagina.  The cervix was visualized, and the strings were  visible. They were grasped and the Paragard  IUD was easily removed intact without  complications. The patient tolerated the procedure well.   Chaperone: Peggy Dones    NEXPLANON INSERTION Risks/benefits/side effects of Nexplanon have been discussed and her questions have been answered.  Specifically, a failure rate of 02/998 has been reported, with an increased failure rate if pt takes St. John's Wort and/or antiseizure medicaitons.  She is aware of the common side effect of irregular bleeding, which the incidence of decreases over time. Signed copy of informed consent in chart.   Time out was performed.  She is right-handed, so her left arm, approximately 10cm from the medial epicondyle and 3-5cm posterior to the sulcus, was cleansed with alcohol and anesthetized with 2cc of 2% Lidocaine.  The area was cleansed again with betadine and the Nexplanon was inserted per manufacturer's recommendations without difficulty.  3 steri-strips and pressure bandage were applied. The patient tolerated the procedure well.  Assessment & Plan:   1) Paragard  IUD removal Follow-up prn problems  2) Nexplanon insertion Pt was instructed to keep the area clean and dry, remove pressure bandage in 24 hours, and keep insertion site covered with the steri-strip for 3-5 days.  Back up contraception was recommended for 2 weeks.  She was given a card indicating date  Nexplanon was inserted and date it needs to be removed. Follow-up PRN problems.  No orders of the defined types were placed in this encounter.   Follow-up: Return in about 1 year (around 03/19/2022) for Pap & physical.  Cheral Marker CNM, [facility] 03/19/2021 11:39 AM

## 2021-03-19 NOTE — Patient Instructions (Signed)
Keep the area clean and dry.  You can remove the big bandage in 24 hours, and the small steri-strip bandage in 3-5 days.  A back up method, such as condoms, should be used for two weeks. You may have irregular vaginal bleeding for the first 6 months after the Nexplanon is placed, then the bleeding usually lightens and it is possible that you may not have any periods.  If you have any concerns, please give us a call.   ? ?Etonogestrel Implant ?What is this medication? ?ETONOGESTREL (et oh noe JES trel) prevents ovulation and pregnancy. It belongs to a group of medications called contraceptives. This medication is a progestin hormone. ?This medicine may be used for other purposes; ask your health care provider or pharmacist if you have questions. ?COMMON BRAND NAME(S): Implanon, Nexplanon ?What should I tell my care team before I take this medication? ?They need to know if you have any of these conditions: ?Abnormal vaginal bleeding ?Blood vessel disease or blood clots ?Breast, cervical, endometrial, ovarian, liver, or uterine cancer ?Diabetes ?Gallbladder disease ?Heart disease or recent heart attack ?High blood pressure ?High cholesterol or triglycerides ?Kidney disease ?Liver disease ?Migraine headaches ?Seizures ?Stroke ?Tobacco smoker ?An unusual or allergic reaction to etonogestrel, anesthetics or antiseptics, other medications, foods, dyes, or preservatives ?Pregnant or trying to get pregnant ?Breast-feeding ?How should I use this medication? ?This device is inserted just under the skin on the inner side of your upper arm by your care team. ?Talk to your care team about the use of this medication in children. Special care may be needed. ?Overdosage: If you think you have taken too much of this medicine contact a poison control center or emergency room at once. ?NOTE: This medicine is only for you. Do not share this medicine with others. ?What if I miss a dose? ?This does not apply. ?What may interact with this  medication? ?Do not take this medication with any of the following: ?Amprenavir ?Fosamprenavir ?This medication may also interact with the following: ?Acitretin ?Aprepitant ?Armodafinil ?Bexarotene ?Bosentan ?Carbamazepine ?Certain medications for fungal infections like fluconazole, ketoconazole, itraconazole and voriconazole ?Certain medications to treat hepatitis, HIV or AIDS ?Cyclosporine ?Felbamate ?Griseofulvin ?Lamotrigine ?Modafinil ?Oxcarbazepine ?Phenobarbital ?Phenytoin ?Primidone ?Rifabutin ?Rifampin ?Rifapentine ?St. John's wort ?Topiramate ?This list may not describe all possible interactions. Give your health care provider a list of all the medicines, herbs, non-prescription drugs, or dietary supplements you use. Also tell them if you smoke, drink alcohol, or use illegal drugs. Some items may interact with your medicine. ?What should I watch for while using this medication? ?This product does not protect you against HIV infection (AIDS) or other sexually transmitted diseases. ?You should be able to feel the implant by pressing your fingertips over the skin where it was inserted. Contact your care team if you cannot feel the implant, and use a non-hormonal birth control method (such as condoms) until your care team confirms that the implant is in place. Contact your care team if you think that the implant may have broken or become bent while in your arm. ?You will receive a user card from your care team after the implant is inserted. The card is a record of the location of the implant in your upper arm and when it should be removed. Keep this card with your health records. ?What side effects may I notice from receiving this medication? ?Side effects that you should report to your care team as soon as possible: ?Allergic reactions--skin rash, itching, hives, swelling of   the face, lips, tongue, or throat ?Blood clot--pain, swelling, or warmth in the leg, shortness of breath, chest pain ?Gallbladder  problems--severe stomach pain, nausea, vomiting, fever ?Increase in blood pressure ?Liver injury--right upper belly pain, loss of appetite, nausea, light-colored stool, dark yellow or brown urine, yellowing skin or eyes, unusual weakness or fatigue ?New or worsening migraines or headaches ?Pain, redness, or irritation at injection site ?Stroke--sudden numbness or weakness of the face, arm, or leg, trouble speaking, confusion, trouble walking, loss of balance or coordination, dizziness, severe headache, change in vision ?Unusual vaginal discharge, itching, or odor ?Worsening mood, feelings of depression ?Side effects that usually do not require medical attention (report to your care team if they continue or are bothersome): ?Breast pain or tenderness ?Dark patches of skin on the face or other sun-exposed areas ?Irregular menstrual cycles or spotting ?Nausea ?Weight gain ?This list may not describe all possible side effects. Call your doctor for medical advice about side effects. You may report side effects to FDA at 1-800-FDA-1088. ?Where should I keep my medication? ?This medication is given in a hospital or clinic and will not be stored at home. ?NOTE: This sheet is a summary. It may not cover all possible information. If you have questions about this medicine, talk to your doctor, pharmacist, or health care provider. ?? 2022 Elsevier/Gold Standard (2020-10-08 00:00:00) ? ?

## 2021-03-28 ENCOUNTER — Other Ambulatory Visit: Payer: Self-pay | Admitting: Advanced Practice Midwife

## 2021-06-28 ENCOUNTER — Other Ambulatory Visit: Payer: Self-pay | Admitting: Advanced Practice Midwife

## 2021-07-03 ENCOUNTER — Other Ambulatory Visit: Payer: Self-pay | Admitting: Advanced Practice Midwife

## 2021-07-07 ENCOUNTER — Other Ambulatory Visit (HOSPITAL_COMMUNITY)
Admission: RE | Admit: 2021-07-07 | Discharge: 2021-07-07 | Disposition: A | Discharge status: Home / Self Care | Payer: Medicaid Other | Source: Ambulatory Visit | Attending: Women's Health | Admitting: Women's Health

## 2021-07-07 ENCOUNTER — Ambulatory Visit (INDEPENDENT_AMBULATORY_CARE_PROVIDER_SITE_OTHER): Payer: Medicaid Other | Admitting: Women's Health

## 2021-07-07 ENCOUNTER — Encounter: Payer: Self-pay | Admitting: Women's Health

## 2021-07-07 VITALS — BP 148/75 | HR 84 | Ht 65.0 in | Wt 363.4 lb

## 2021-07-07 DIAGNOSIS — Z113 Encounter for screening for infections with a predominantly sexual mode of transmission: Secondary | ICD-10-CM

## 2021-07-07 DIAGNOSIS — Z3009 Encounter for other general counseling and advice on contraception: Secondary | ICD-10-CM | POA: Insufficient documentation

## 2021-07-07 DIAGNOSIS — Z0000 Encounter for general adult medical examination without abnormal findings: Secondary | ICD-10-CM | POA: Diagnosis present

## 2021-07-07 DIAGNOSIS — Z01419 Encounter for gynecological examination (general) (routine) without abnormal findings: Secondary | ICD-10-CM | POA: Diagnosis not present

## 2021-07-07 DIAGNOSIS — I10 Essential (primary) hypertension: Secondary | ICD-10-CM

## 2021-07-07 MED ORDER — AMLODIPINE BESYLATE 10 MG PO TABS: 10.0000 mg | ORAL_TABLET | Freq: Every day | ORAL | 3 refills | Status: DC

## 2021-07-07 NOTE — Progress Notes (Signed)
WELL-WOMAN EXAMINATION Patient name: Tammie Black MRN [id number]  Date of birth: [birth year] Chief Complaint:   Gynecologic Exam  History of Present Illness:   Tammie Black is a 26 y.o. G110P2012 Caucasian female being seen today for a routine well-woman exam.  Current complaints: none, out of norvasc x 1wk, needs refill  PCP: Gennette Pac      No LMP recorded. Patient has had an implant. The current method of family planning is Nexplanon, not having periods.  Last pap 05/17/19. Results were: NILM w/ HRHPV negative. H/O abnormal pap: no Last mammogram: never. Results were: N/A. Family h/o breast cancer: no Last colonoscopy: never. Results were: N/A. Family h/o colorectal cancer: no     07/07/2021    8:56 AM 07/02/2020    8:41 AM 05/17/2019    9:52 AM 06/08/2017   10:34 AM 05/10/2017    2:56 PM  Depression screen PHQ 2/9  Decreased Interest 0 0 0 0 0  Down, Depressed, Hopeless 0 0 0 0 0  PHQ - 2 Score 0 0 0 0 0  Altered sleeping 0 0 0 0   Tired, decreased energy 0 1 0 0   Change in appetite 0 0 0 0   Feeling bad or failure about yourself  0 0 0    Trouble concentrating 0 0 0 0   Moving slowly or fidgety/restless 0 0 0    Suicidal thoughts 0 0 0 0   PHQ-9 Score 0 1 0 0         07/07/2021    8:56 AM 07/02/2020    8:41 AM 05/17/2019    9:53 AM  GAD 7 : Generalized Anxiety Score  Nervous, Anxious, on Edge 0 0 0  Control/stop worrying 0 0 0  Worry too much - different things 0 0 0  Trouble relaxing 0 0 0  Restless 0 0 0  Easily annoyed or irritable 0 0 0  Afraid - awful might happen 0 0 0  Total GAD 7 Score 0 0 0     Review of Systems:   Pertinent items are noted in HPI Denies any headaches, blurred vision, fatigue, shortness of breath, chest pain, abdominal pain, abnormal vaginal discharge/itching/odor/irritation, problems with periods, bowel movements, urination, or intercourse unless otherwise stated above. Pertinent History Reviewed:  Reviewed past medical,surgical,  social and family history.  Reviewed problem list, medications and allergies. Physical Assessment:   Vitals:   07/07/21 0857  BP: (!) 148/75  Pulse: 84  Weight: (!) 363 lb 6.4 oz (164.8 kg)  Height: 5\' 5"  (1.651 m)  Body mass index is 60.47 kg/m.        Physical Examination:   General appearance - well appearing, and in no distress  Mental status - alert, oriented to person, place, and time  Psych:  She has a normal mood and affect  Skin - warm and dry, normal color, no suspicious lesions noted  Chest - effort normal, all lung fields clear to auscultation bilaterally  Heart - normal rate and regular rhythm  Neck:  midline trachea, no thyromegaly or nodules  Breasts - breasts appear normal, no suspicious masses, no skin or nipple changes or  axillary nodes  Abdomen - soft, nontender, nondistended, no masses or organomegaly  Pelvic - VULVA: normal appearing vulva with no masses, tenderness or lesions  VAGINA: normal appearing vagina with normal color and discharge, no lesions  CERVIX: normal appearing cervix without discharge or lesions, no CMT  Thin prep pap is  not done  UTERUS: uterus is felt to be normal size, shape, consistency and nontender   ADNEXA: No adnexal masses or tenderness noted.  Extremities:  No swelling or varicosities noted  Chaperone: N/A    No results found for this or any previous visit (from the past 24 hour(s)).  Assessment & Plan:  1) Well-Woman Exam  2) CHTN> out of norvasc x 1wk, refilled  3) [facility] STD screening  Labs/procedures today: CV swab, STD labs  Mammogram: @ 27yo, or sooner if problems Colonoscopy: @ 27yo, or sooner if problems  No orders of the defined types were placed in this encounter.   Meds:  Meds ordered this encounter  Medications   amLODipine (NORVASC) 10 MG tablet    Sig: Take 1 tablet (10 mg total) by mouth daily.    Dispense:  90 tablet    Refill:  3    Order Specific Question:   Supervising Provider    Answer:    Lazaro Arms [[id number]]    Follow-up: Return in about 1 year (around 07/08/2022) for Pap & physical.  Cheral Marker CNM, [facility] 07/07/2021 9:23 AM

## 2021-07-08 LAB — CERVICOVAGINAL ANCILLARY ONLY
Chlamydia: NEGATIVE
Comment: NEGATIVE
Comment: NEGATIVE
Comment: NORMAL
Neisseria Gonorrhea: NEGATIVE
Trichomonas: NEGATIVE

## 2021-07-08 LAB — HIV ANTIBODY (ROUTINE TESTING W REFLEX): HIV Screen 4th Generation wRfx: NONREACTIVE

## 2021-07-08 LAB — RPR: RPR Ser Ql: NONREACTIVE

## 2022-07-22 ENCOUNTER — Other Ambulatory Visit: Payer: Self-pay | Admitting: Women's Health
